# Patient Record
Sex: Male | Born: 1965 | Race: White | Hispanic: No | Smoking: Current every day smoker
Health system: Southern US, Community
[De-identification: ages and names within clinical notes are randomized; demographics above are authoritative.]

## PROBLEM LIST (undated history)

## (undated) DIAGNOSIS — I10 Essential (primary) hypertension: Secondary | ICD-10-CM

---

## 2004-06-30 ENCOUNTER — Emergency Department (HOSPITAL_COMMUNITY): Admission: EM | Admit: 2004-06-30 | Discharge: 2004-06-30 | Payer: Self-pay | Admitting: Emergency Medicine

## 2018-12-22 ENCOUNTER — Inpatient Hospital Stay (HOSPITAL_BASED_OUTPATIENT_CLINIC_OR_DEPARTMENT_OTHER)
Admission: EM | Admit: 2018-12-22 | Discharge: 2019-01-03 | DRG: 193 | Disposition: A | Discharge status: Home / Self Care | Payer: Medicare Other | Attending: Internal Medicine | Admitting: Internal Medicine

## 2018-12-22 ENCOUNTER — Encounter (HOSPITAL_BASED_OUTPATIENT_CLINIC_OR_DEPARTMENT_OTHER): Payer: Self-pay | Admitting: Emergency Medicine

## 2018-12-22 ENCOUNTER — Other Ambulatory Visit: Payer: Self-pay

## 2018-12-22 ENCOUNTER — Emergency Department (HOSPITAL_BASED_OUTPATIENT_CLINIC_OR_DEPARTMENT_OTHER): Payer: Medicare Other

## 2018-12-22 DIAGNOSIS — R740 Nonspecific elevation of levels of transaminase and lactic acid dehydrogenase [LDH]: Secondary | ICD-10-CM | POA: Diagnosis present

## 2018-12-22 DIAGNOSIS — S2242XD Multiple fractures of ribs, left side, subsequent encounter for fracture with routine healing: Secondary | ICD-10-CM

## 2018-12-22 DIAGNOSIS — J189 Pneumonia, unspecified organism: Secondary | ICD-10-CM

## 2018-12-22 DIAGNOSIS — E871 Hypo-osmolality and hyponatremia: Secondary | ICD-10-CM | POA: Diagnosis not present

## 2018-12-22 DIAGNOSIS — R079 Chest pain, unspecified: Secondary | ICD-10-CM | POA: Diagnosis not present

## 2018-12-22 DIAGNOSIS — J80 Acute respiratory distress syndrome: Secondary | ICD-10-CM | POA: Diagnosis present

## 2018-12-22 DIAGNOSIS — R7982 Elevated C-reactive protein (CRP): Secondary | ICD-10-CM | POA: Diagnosis present

## 2018-12-22 DIAGNOSIS — R918 Other nonspecific abnormal finding of lung field: Secondary | ICD-10-CM

## 2018-12-22 DIAGNOSIS — Z791 Long term (current) use of non-steroidal anti-inflammatories (NSAID): Secondary | ICD-10-CM | POA: Diagnosis not present

## 2018-12-22 DIAGNOSIS — R0989 Other specified symptoms and signs involving the circulatory and respiratory systems: Secondary | ICD-10-CM | POA: Diagnosis not present

## 2018-12-22 DIAGNOSIS — J449 Chronic obstructive pulmonary disease, unspecified: Secondary | ICD-10-CM | POA: Diagnosis not present

## 2018-12-22 DIAGNOSIS — W1843XA Slipping, tripping and stumbling without falling due to stepping from one level to another, initial encounter: Secondary | ICD-10-CM | POA: Diagnosis present

## 2018-12-22 DIAGNOSIS — Z79899 Other long term (current) drug therapy: Secondary | ICD-10-CM | POA: Diagnosis not present

## 2018-12-22 DIAGNOSIS — N400 Enlarged prostate without lower urinary tract symptoms: Secondary | ICD-10-CM | POA: Diagnosis present

## 2018-12-22 DIAGNOSIS — F329 Major depressive disorder, single episode, unspecified: Secondary | ICD-10-CM | POA: Diagnosis present

## 2018-12-22 DIAGNOSIS — W109XXA Fall (on) (from) unspecified stairs and steps, initial encounter: Secondary | ICD-10-CM | POA: Diagnosis present

## 2018-12-22 DIAGNOSIS — W19XXXA Unspecified fall, initial encounter: Secondary | ICD-10-CM | POA: Diagnosis not present

## 2018-12-22 DIAGNOSIS — F17200 Nicotine dependence, unspecified, uncomplicated: Secondary | ICD-10-CM | POA: Diagnosis not present

## 2018-12-22 DIAGNOSIS — I10 Essential (primary) hypertension: Secondary | ICD-10-CM | POA: Diagnosis present

## 2018-12-22 DIAGNOSIS — J440 Chronic obstructive pulmonary disease with acute lower respiratory infection: Secondary | ICD-10-CM | POA: Diagnosis present

## 2018-12-22 DIAGNOSIS — S2241XA Multiple fractures of ribs, right side, initial encounter for closed fracture: Secondary | ICD-10-CM | POA: Diagnosis present

## 2018-12-22 DIAGNOSIS — F17210 Nicotine dependence, cigarettes, uncomplicated: Secondary | ICD-10-CM | POA: Diagnosis present

## 2018-12-22 DIAGNOSIS — R0902 Hypoxemia: Secondary | ICD-10-CM | POA: Diagnosis not present

## 2018-12-22 DIAGNOSIS — J181 Lobar pneumonia, unspecified organism: Secondary | ICD-10-CM | POA: Diagnosis present

## 2018-12-22 DIAGNOSIS — J9601 Acute respiratory failure with hypoxia: Secondary | ICD-10-CM | POA: Diagnosis present

## 2018-12-22 DIAGNOSIS — S2249XA Multiple fractures of ribs, unspecified side, initial encounter for closed fracture: Secondary | ICD-10-CM | POA: Diagnosis present

## 2018-12-22 DIAGNOSIS — R069 Unspecified abnormalities of breathing: Secondary | ICD-10-CM

## 2018-12-22 DIAGNOSIS — Z87311 Personal history of (healed) other pathological fracture: Secondary | ICD-10-CM | POA: Diagnosis not present

## 2018-12-22 DIAGNOSIS — I499 Cardiac arrhythmia, unspecified: Secondary | ICD-10-CM | POA: Diagnosis not present

## 2018-12-22 DIAGNOSIS — R0602 Shortness of breath: Secondary | ICD-10-CM | POA: Diagnosis not present

## 2018-12-22 DIAGNOSIS — S2239XA Fracture of one rib, unspecified side, initial encounter for closed fracture: Secondary | ICD-10-CM | POA: Diagnosis not present

## 2018-12-22 DIAGNOSIS — R000 Tachycardia, unspecified: Secondary | ICD-10-CM | POA: Diagnosis not present

## 2018-12-22 DIAGNOSIS — R509 Fever, unspecified: Secondary | ICD-10-CM | POA: Diagnosis not present

## 2018-12-22 DIAGNOSIS — R05 Cough: Secondary | ICD-10-CM | POA: Diagnosis not present

## 2018-12-22 DIAGNOSIS — K219 Gastro-esophageal reflux disease without esophagitis: Secondary | ICD-10-CM | POA: Diagnosis present

## 2018-12-22 DIAGNOSIS — J441 Chronic obstructive pulmonary disease with (acute) exacerbation: Secondary | ICD-10-CM | POA: Diagnosis present

## 2018-12-22 DIAGNOSIS — R0689 Other abnormalities of breathing: Secondary | ICD-10-CM | POA: Diagnosis not present

## 2018-12-22 HISTORY — DX: Essential (primary) hypertension: I10

## 2018-12-22 LAB — COMPREHENSIVE METABOLIC PANEL
ALBUMIN: 4.7 g/dL (ref 3.5–5.0)
ALT: 14 U/L (ref 0–44)
ANION GAP: 17 — AB (ref 5–15)
AST: 32 U/L (ref 15–41)
Alkaline Phosphatase: 65 U/L (ref 38–126)
BUN: 14 mg/dL (ref 6–20)
CO2: 20 mmol/L — AB (ref 22–32)
Calcium: 9 mg/dL (ref 8.9–10.3)
Chloride: 99 mmol/L (ref 98–111)
Creatinine, Ser: 0.75 mg/dL (ref 0.61–1.24)
GFR calc Af Amer: >60 mL/min (ref >60)
GFR calc non Af Amer: >60 mL/min (ref >60)
GLUCOSE: 131 mg/dL — AB (ref 70–99)
POTASSIUM: 3.5 mmol/L (ref 3.5–5.1)
SODIUM: 136 mmol/L (ref 135–145)
Total Bilirubin: 1.7 mg/dL — ABNORMAL HIGH (ref 0.3–1.2)
Total Protein: 8.7 g/dL — ABNORMAL HIGH (ref 6.5–8.1)

## 2018-12-22 LAB — D-DIMER, QUANTITATIVE: D-Dimer, Quant: 2.41 ug/mL-FEU — ABNORMAL HIGH (ref 0.00–0.50)

## 2018-12-22 LAB — RESPIRATORY PANEL BY PCR

## 2018-12-22 LAB — INFLUENZA PANEL BY PCR (TYPE A & B)
INFLAPCR: NEGATIVE
Influenza B By PCR: NEGATIVE

## 2018-12-22 LAB — BRAIN NATRIURETIC PEPTIDE: B NATRIURETIC PEPTIDE 5: 135.2 pg/mL — AB (ref 0.0–100.0)

## 2018-12-22 LAB — TROPONIN I: Troponin I: <0.03 ng/mL (ref <0.03)

## 2018-12-22 MED ORDER — AZITHROMYCIN 500 MG IV SOLR
INTRAVENOUS | Status: AC
  Filled 2018-12-22: qty 500

## 2018-12-22 MED ORDER — LORAZEPAM 2 MG/ML IJ SOLN: 1.0000 mg | Freq: Four times a day (QID) | INTRAMUSCULAR | Status: AC | PRN

## 2018-12-22 MED ORDER — SODIUM CHLORIDE 0.9 % IV SOLN: 1.0000 g | INTRAVENOUS | Status: DC

## 2018-12-22 MED ORDER — UMECLIDINIUM-VILANTEROL 62.5-25 MCG/INH IN AEPB
1.0000 | INHALATION_SPRAY | Freq: Every day | RESPIRATORY_TRACT | Status: DC
  Administered 2018-12-24 – 2018-12-27 (×4): 1 via RESPIRATORY_TRACT
  Filled 2018-12-22 (×2): qty 14

## 2018-12-22 MED ORDER — ADULT MULTIVITAMIN W/MINERALS CH
1.0000 | ORAL_TABLET | Freq: Every day | ORAL | Status: DC
  Administered 2018-12-22 – 2019-01-03 (×13): 1 via ORAL
  Filled 2018-12-22 (×13): qty 1

## 2018-12-22 MED ORDER — IPRATROPIUM-ALBUTEROL 0.5-2.5 (3) MG/3ML IN SOLN
3.0000 mL | Freq: Four times a day (QID) | RESPIRATORY_TRACT | Status: DC
  Administered 2018-12-22: 3 mL via RESPIRATORY_TRACT
  Filled 2018-12-22 (×2): qty 3

## 2018-12-22 MED ORDER — SODIUM CHLORIDE 0.9 % IV SOLN
500.0000 mg | INTRAVENOUS | Status: DC
  Filled 2018-12-22: qty 500

## 2018-12-22 MED ORDER — THIAMINE HCL 100 MG/ML IJ SOLN
100.0000 mg | Freq: Every day | INTRAMUSCULAR | Status: DC
  Filled 2018-12-22 (×5): qty 2

## 2018-12-22 MED ORDER — FOLIC ACID 1 MG PO TABS
1.0000 mg | ORAL_TABLET | Freq: Every day | ORAL | Status: DC
  Administered 2018-12-22 – 2019-01-03 (×13): 1 mg via ORAL
  Filled 2018-12-22 (×14): qty 1

## 2018-12-22 MED ORDER — OXYCODONE HCL 5 MG PO TABS
5.0000 mg | ORAL_TABLET | ORAL | Status: DC | PRN
  Administered 2018-12-22 – 2019-01-03 (×29): 10 mg via ORAL
  Filled 2018-12-22 (×29): qty 2

## 2018-12-22 MED ORDER — SODIUM CHLORIDE 0.9 % IV SOLN
500.0000 mg | INTRAVENOUS | Status: DC
  Administered 2018-12-22 – 2018-12-29 (×8): 500 mg via INTRAVENOUS
  Filled 2018-12-22 (×9): qty 500

## 2018-12-22 MED ORDER — METHYLPREDNISOLONE SODIUM SUCC 125 MG IJ SOLR
60.0000 mg | INTRAMUSCULAR | Status: AC
  Administered 2018-12-22 – 2018-12-23 (×6): 60 mg via INTRAVENOUS
  Filled 2018-12-22 (×6): qty 2

## 2018-12-22 MED ORDER — IPRATROPIUM-ALBUTEROL 0.5-2.5 (3) MG/3ML IN SOLN
3.0000 mL | Freq: Four times a day (QID) | RESPIRATORY_TRACT | Status: DC
  Administered 2018-12-22 (×3): 3 mL via RESPIRATORY_TRACT
  Filled 2018-12-22 (×2): qty 3

## 2018-12-22 MED ORDER — MORPHINE SULFATE (PF) 4 MG/ML IV SOLN
4.0000 mg | Freq: Once | INTRAVENOUS | Status: AC
  Administered 2018-12-22: 4 mg via INTRAVENOUS
  Filled 2018-12-22: qty 1

## 2018-12-22 MED ORDER — ALBUTEROL SULFATE (2.5 MG/3ML) 0.083% IN NEBU
2.5000 mg | INHALATION_SOLUTION | Freq: Three times a day (TID) | RESPIRATORY_TRACT | Status: DC
  Administered 2018-12-23 – 2018-12-24 (×5): 2.5 mg via RESPIRATORY_TRACT
  Filled 2018-12-22 (×6): qty 3

## 2018-12-22 MED ORDER — SODIUM CHLORIDE 0.9 % IV SOLN
2.0000 g | INTRAVENOUS | Status: DC
  Administered 2018-12-22 – 2018-12-31 (×10): 2 g via INTRAVENOUS
  Filled 2018-12-22 (×2): qty 2
  Filled 2018-12-22: qty 20
  Filled 2018-12-22 (×2): qty 2
  Filled 2018-12-22: qty 20
  Filled 2018-12-22 (×4): qty 2

## 2018-12-22 MED ORDER — LORAZEPAM 1 MG PO TABS
1.0000 mg | ORAL_TABLET | Freq: Four times a day (QID) | ORAL | Status: AC | PRN
  Administered 2018-12-23: 1 mg via ORAL
  Filled 2018-12-22: qty 1

## 2018-12-22 MED ORDER — SODIUM CHLORIDE 0.9 % IV BOLUS
1000.0000 mL | Freq: Once | INTRAVENOUS | Status: AC
  Administered 2018-12-22: 1000 mL via INTRAVENOUS

## 2018-12-22 MED ORDER — ENOXAPARIN SODIUM 40 MG/0.4ML ~~LOC~~ SOLN
40.0000 mg | SUBCUTANEOUS | Status: DC
  Administered 2018-12-23 – 2018-12-24 (×2): 40 mg via SUBCUTANEOUS
  Filled 2018-12-22 (×2): qty 0.4

## 2018-12-22 MED ORDER — PREDNISONE 20 MG PO TABS: 40.0000 mg | ORAL_TABLET | Freq: Every day | ORAL | Status: DC

## 2018-12-22 MED ORDER — ALBUTEROL SULFATE (2.5 MG/3ML) 0.083% IN NEBU
2.5000 mg | INHALATION_SOLUTION | RESPIRATORY_TRACT | Status: DC | PRN
  Administered 2018-12-22 – 2018-12-27 (×2): 2.5 mg via RESPIRATORY_TRACT
  Filled 2018-12-22 (×2): qty 3

## 2018-12-22 MED ORDER — IOPAMIDOL (ISOVUE-370) INJECTION 76%
100.0000 mL | Freq: Once | INTRAVENOUS | Status: AC | PRN
  Administered 2018-12-22: 100 mL via INTRAVENOUS

## 2018-12-22 MED ORDER — VITAMIN B-1 100 MG PO TABS
100.0000 mg | ORAL_TABLET | Freq: Every day | ORAL | Status: DC
  Administered 2018-12-22 – 2019-01-03 (×13): 100 mg via ORAL
  Filled 2018-12-22 (×13): qty 1

## 2018-12-22 NOTE — ED Notes (Signed)
Report called to 1515  rn

## 2018-12-22 NOTE — ED Triage Notes (Signed)
Pt reports fall 2  Days ago, ribs pain. Shortness of breath pt in obvious resp distress, alert and oriented x 4.

## 2018-12-22 NOTE — ED Provider Notes (Signed)
Emergency Department Provider Note   I have reviewed the triage vital signs and the nursing notes.   HISTORY  Chief Complaint Shortness of Breath   HPI Eugene Christensen is a 53 y.o. male with PMH of tobacco use and HTN resents to the emergency department for evaluation of difficulty breathing and left chest pain.  Patient states that he had a mechanical fall down several stairs last week and has had rib pain since the fall.  He has developed worsening shortness of breath in the interim associated pain.  He denies productive cough.  No fevers or chills.  Patient does smoke cigarettes but is trying to cut back.  He has no known history of COPD or asthma.  No history of PE or DVT.  Denies pain in his abdomen, back, arms/legs.    Past Medical History:  Diagnosis Date  . Hypertension     Patient Active Problem List   Diagnosis Date Noted  . Pneumonia 12/22/2018  . Multiple rib fractures 12/22/2018  . COPD with acute exacerbation (HCC) 12/22/2018  . Essential hypertension 12/22/2018    History reviewed. No pertinent surgical history.  Allergies Patient has no known allergies.  History reviewed. No pertinent family history.  Social History Social History   Tobacco Use  . Smoking status: Current Every Day Smoker  . Smokeless tobacco: Never Used  Substance Use Topics  . Alcohol use: Yes  . Drug use: Never    Review of Systems  Constitutional: No fever/chills Eyes: No visual changes. ENT: No sore throat. Cardiovascular: Positive chest pain. Respiratory: Positive shortness of breath. Gastrointestinal: No abdominal pain.  No nausea, no vomiting.  No diarrhea.  No constipation. Genitourinary: Negative for dysuria. Musculoskeletal: Negative for back pain. Skin: Negative for rash. Neurological: Negative for headaches, focal weakness or numbness.  10-point ROS otherwise negative.  ____________________________________________   PHYSICAL EXAM:  VITAL SIGNS: ED Triage  Vitals  Enc Vitals Group     BP 12/22/18 1035 (!) 166/101     Pulse Rate 12/22/18 1035 (!) 120     Resp 12/22/18 1035 (!) 26     Temp 12/22/18 1035 98 F (36.7 C)     Temp Source 12/22/18 1035 Oral     SpO2 12/22/18 1035 (!) 70 %     Pain Score 12/22/18 1047 10   Constitutional: Alert and oriented. Patient appears acutely ill and with increased shortness of breath.  Eyes: Conjunctivae are normal.  Head: Atraumatic. Nose: No congestion/rhinnorhea. Mouth/Throat: Mucous membranes are moist. Neck: No stridor.  Cardiovascular: Tachycardia. Good peripheral circulation. Grossly normal heart sounds.   Respiratory: Increased respiratory effort.  No retractions. Lungs with poor air entry and end-expiratory wheezing on exam.  Gastrointestinal: Soft and nontender. No distention.  Musculoskeletal: No lower extremity tenderness nor edema. No gross deformities of extremities. Neurologic:  Normal speech and language. No gross focal neurologic deficits are appreciated.  Skin:  Skin is warm, dry and intact. No rash noted.  ____________________________________________   LABS (all labs ordered are listed, but only abnormal results are displayed)  Labs Reviewed  COMPREHENSIVE METABOLIC PANEL - Abnormal; Notable for the following components:      Result Value   CO2 20 (*)    Glucose, Bld 131 (*)    Total Protein 8.7 (*)    Total Bilirubin 1.7 (*)    Anion gap 17 (*)    All other components within normal limits  BRAIN NATRIURETIC PEPTIDE - Abnormal; Notable for the following components:  B Natriuretic Peptide 135.2 (*)    All other components within normal limits  D-DIMER, QUANTITATIVE (NOT AT [facility]) - Abnormal; Notable for the following components:   D-Dimer, Quant 2.41 (*)    All other components within normal limits  CULTURE, BLOOD (ROUTINE X 2)  CULTURE, BLOOD (ROUTINE X 2)  CULTURE, BLOOD (ROUTINE X 2)  CULTURE, BLOOD (ROUTINE X 2)  EXPECTORATED SPUTUM ASSESSMENT W REFEX TO RESP CULTURE   GRAM STAIN  RESPIRATORY PANEL BY PCR  TROPONIN I  INFLUENZA PANEL BY PCR (TYPE A & B)  RAPID URINE DRUG SCREEN, HOSP PERFORMED  HIV ANTIBODY (ROUTINE TESTING W REFLEX)  STREP PNEUMONIAE URINARY ANTIGEN  CBC WITH DIFFERENTIAL/PLATELET  CBC  CREATININE, SERUM  ADENOVIRUS ANTIBODIES   ____________________________________________  EKG   EKG Interpretation  Date/Time:  Monday December 22 2018 10:41:02 EDT Ventricular Rate:  125 PR Interval:  150 QRS Duration: 84 QT Interval:  316 QTC Calculation: 456 R Axis:   86 Text Interpretation:  Sinus tachycardia Biatrial enlargement Junctional ST depression, probably normal Abnormal ECG No STEMI.  Confirmed by Alona Bene [phone]) on 12/22/2018 11:23:39 AM       ____________________________________________  RADIOLOGY  Ct Angio Chest Pe W And/or Wo Contrast  Result Date: 12/22/2018 CLINICAL DATA:  Shortness of breath PE suspected, high pretest prob EXAM: CT ANGIOGRAPHY CHEST WITH CONTRAST TECHNIQUE: Multidetector CT imaging of the chest was performed using the standard protocol during bolus administration of intravenous contrast. Multiplanar CT image reconstructions and MIPs were obtained to evaluate the vascular anatomy. CONTRAST:  ISOVUE-370 IOPAMIDOL (ISOVUE-370) INJECTION 76% COMPARISON:  None. FINDINGS: Cardiovascular: Heart size normal. No pericardial effusion. Satisfactory opacification of pulmonary arteries noted, and there is no evidence of pulmonary emboli. Moderate coronary calcifications. Adequate contrast opacification of the thoracic aorta with no evidence of dissection, aneurysm, or stenosis. There is classic 3-vessel brachiocephalic arch anatomy without proximal stenosis. Mild calcified plaque in the arch and descending thoracic aorta. Visualized proximal abdominal aorta mildly atheromatous, otherwise unremarkable. Mediastinum/Nodes: No hilar or mediastinal adenopathy. Lungs/Pleura: No pleural effusion. No pneumothorax.  Pulmonary emphysema. Patchy peribronchial airspace opacities medially in the left lower lobe. Upper Abdomen: No acute findings. Musculoskeletal: L1 compression fracture deformity, probably chronic. No acute fracture or worrisome bone lesion. Ununited fracture of the posterolateral aspect right sixth rib. Old healed right eleventh rib fracture. Review of the MIP images confirms the above findings. IMPRESSION: 1. Negative for acute PE or thoracic aortic dissection. 2. Coronary and Aortic Atherosclerosis (ICD10-170.0). 3. Patchy peribronchial airspace opacities medially in the left lower lobe suggesting pneumonia. Electronically Signed   By: Corlis Leak M.D.   On: 12/22/2018 12:20   Dg Chest Portable 1 View  Result Date: 12/22/2018 CLINICAL DATA:  Shortness of breath.  Recent fall EXAM: PORTABLE CHEST 1 VIEW COMPARISON:  June 30, 2004 FINDINGS: There is no evident edema or consolidation. Heart size and pulmonary vascularity are normal. No adenopathy. There is aortic atherosclerosis. No pneumothorax. There is an apparent recent fracture of the posterior right sixth rib. There is an older fracture of the posterior right ninth rib. IMPRESSION: Apparent acute/recent fracture of the posterior right sixth rib. No pneumothorax. No edema or consolidation. Heart size normal. Aortic Atherosclerosis (ICD10-I70.0). Electronically Signed   By: Bretta Bang III M.D.   On: 12/22/2018 11:19    ____________________________________________   PROCEDURES  Procedure(s) performed:   Procedures  CRITICAL CARE Performed by: Maia Plan Total critical care time: 35 minutes Critical care time was exclusive  of separately billable procedures and treating other patients. Critical care was necessary to treat or prevent imminent or life-threatening deterioration. Critical care was time spent personally by me on the following activities: development of treatment plan with patient and/or surrogate as well as nursing,  discussions with consultants, evaluation of patient's response to treatment, examination of patient, obtaining history from patient or surrogate, ordering and performing treatments and interventions, ordering and review of laboratory studies, ordering and review of radiographic studies, pulse oximetry and re-evaluation of patient's condition.  Alona Bene, MD Emergency Medicine  ____________________________________________   INITIAL IMPRESSION / ASSESSMENT AND PLAN / ED COURSE  Pertinent labs & imaging results that were available during my care of the patient were reviewed by me and considered in my medical decision making (see chart for details).  Patient presents to the emergency department with shortness of breath, wheezing in the setting of mechanical fall and likely rib fracture on the left.  On my initial exam the patient has increased work of breathing and wheezing.  He was given back-to-back nebs with somewhat improved vital signs but is requiring supplemental oxygen.  He has bilateral breath sounds with suspicion for pneumothorax being low.  Portable x-ray shows no obvious pneumothorax or infiltrate.  He is not experiencing significant infection symptoms.  No international travel history.  Plan for oxygen supplementation and will follow labs.  Do plan for CTA of the chest to rule out PE, occult pneumonia, or small pneumothorax.   CXR without PNX or obvious infiltrate.   CTA without PE. Does show developing PNA near the fractured ribs. Added abx. On re-evaluation the patient is improving but requiring 4L [place] O2. Plan for abx and admit for further monitoring.  ____________________________________________  FINAL CLINICAL IMPRESSION(S) / ED DIAGNOSES  Final diagnoses:  Hypoxia  SOB (shortness of breath)  Community acquired pneumonia of left lower lobe of lung (HCC)     MEDICATIONS GIVEN DURING THIS VISIT:  Medications  ipratropium-albuterol (DUONEB) 0.5-2.5 (3) MG/3ML nebulizer  solution 3 mL (3 mLs Nebulization Given 12/22/18 1952)  cefTRIAXone (ROCEPHIN) 2 g in sodium chloride 0.9 % 100 mL IVPB (2 g Intravenous Transfusing/Transfer 12/22/18 1430)  azithromycin (ZITHROMAX) 500 mg in sodium chloride 0.9 % 250 mL IVPB ( Intravenous Stopped 12/22/18 1420)  azithromycin (ZITHROMAX) 500 MG injection (has no administration in time range)  enoxaparin (LOVENOX) injection 40 mg (40 mg Subcutaneous Not Given 12/22/18 1520)  umeclidinium-vilanterol (ANORO ELLIPTA) 62.5-25 MCG/INH 1 puff (has no administration in time range)  methylPREDNISolone sodium succinate (SOLU-MEDROL) 125 mg/2 mL injection 60 mg (60 mg Intravenous Given 12/22/18 1858)    Followed by  predniSONE (DELTASONE) tablet 40 mg (has no administration in time range)  albuterol (PROVENTIL) (2.5 MG/3ML) 0.083% nebulizer solution 2.5 mg (2.5 mg Nebulization Given 12/22/18 1418)  oxyCODONE (Oxy IR/ROXICODONE) immediate release tablet 5-10 mg (10 mg Oral Given 12/22/18 1650)  LORazepam (ATIVAN) tablet 1 mg (has no administration in time range)    Or  LORazepam (ATIVAN) injection 1 mg (has no administration in time range)  thiamine (VITAMIN B-1) tablet 100 mg (has no administration in time range)    Or  thiamine (B-1) injection 100 mg (has no administration in time range)  folic acid (FOLVITE) tablet 1 mg (has no administration in time range)  multivitamin with minerals tablet 1 tablet (has no administration in time range)  sodium chloride 0.9 % bolus 1,000 mL ( Intravenous Stopped 12/22/18 1214)  iopamidol (ISOVUE-370) 76 % injection 100 mL (100 mLs Intravenous  Contrast Given 12/22/18 1157)  morphine 4 MG/ML injection 4 mg (4 mg Intravenous Given 12/22/18 1301)    Note:  This document was prepared using Dragon voice recognition software and may include unintentional dictation errors.  Alona Bene, MD Emergency Medicine    Eliazer Hemphill, Arlyss Repress, MD 12/22/18 2035

## 2018-12-22 NOTE — H&P (Signed)
Eugene Christensen is an 53 y.o. male.   Chief Complaint: Fever, cough, shortness of breath. Chest pain. HPI: The patient is a 53 yr old man who fell while going up his stairs at home last Friday. He states that when he fell he immediately had pain on the left side of his chest. He has broken ribs before, and recognizd it. He states that he started feeling sick Saturday morning. He has had cough, shortness of breath and fevers.   The patient has a past medical history significant for hypertension and COPD.   In the ED the patient had a temperature of 98 degrees. He had a respiratory rate of 25. Heart rate was 95. Blood pressure was 181/99. He was saturating at 93% on 6L O2. This has been weaned down to 2L.  Sodium was 136. Potassium was 3.5. CO2 is 20. Creatinine is 0.75. LFT's are in normal range. BNP is 135.2. Troponin ! Was 0.03. D Dimer was 2.41. He is negative for influenza A and B. Respiratory panel is pending.  CXR demonstrated hyperexpansion of the lungs, fracture of the posterior right sixth rib. No pneumothorax or consolidation is seen.  CTA Chest demonstrated no acute PE or thoracic aortic dissection. There was a patchy peribronchial airspace opacities medially in the left lower lobe suggesting pneumonia.   Past Medical History:  Diagnosis Date  . Hypertension     History reviewed. No pertinent surgical history.  History reviewed. No pertinent family history. Social History:  reports that he has been smoking. He has never used smokeless tobacco. He reports current alcohol use. He reports that he does not use drugs. Medications Prior to Admission  Medication Sig Dispense Refill  . acetaminophen (TYLENOL) 500 MG tablet Take 500 mg by mouth daily as needed for moderate pain.    . Albuterol Sulfate (PROAIR HFA IN) Inhale 2 puffs into the lungs every 6 (six) hours as needed (SOB/wheezing).    Marland Kitchen alfuzosin (UROXATRAL) 10 MG 24 hr tablet Take 10 mg by mouth daily.    Marland Kitchen CALCIUM PO Take 1  tablet by mouth daily.    . finasteride (PROSCAR) 5 MG tablet Take 5 mg by mouth daily.    . meloxicam (MOBIC) 15 MG tablet Take 15 mg by mouth daily as needed for pain.    Marland Kitchen omeprazole (PRILOSEC) 20 MG capsule Take 20 mg by mouth daily.    . sertraline (ZOLOFT) 50 MG tablet Take 50 mg by mouth daily.    Marland Kitchen thiamine 100 MG tablet Take 100 mg by mouth daily.    . Tiotropium Bromide-Olodaterol (STIOLTO RESPIMAT) 2.5-2.5 MCG/ACT AERS Inhale 2 puffs into the lungs daily.    . traZODone (DESYREL) 50 MG tablet Take 50 mg by mouth at bedtime.      Allergies: No Known Allergies  Pertinent items are noted in HPI.  General appearance: alert, cooperative and no distress Head: Normocephalic, without obvious abnormality, atraumatic Eyes: conjunctivae/corneas clear. PERRL, EOM's intact. Fundi benign. Throat: lips, mucosa, and tongue normal; teeth and gums normal Neck: no adenopathy, no carotid bruit, no JVD, supple, symmetrical, trachea midline and thyroid not enlarged, symmetric, no tenderness/mass/nodules Resp: No increased work of breathing. Positive for wheezes throughout. Positive for rales on the left. No rhonchi. No tactile fremitujs Chest wall: no tenderness Cardio: regular rate and rhythm, S1, S2 normal, no murmur, click, rub or gallop GI: soft, non-tender; bowel sounds normal; no masses,  no organomegaly Extremities: extremities normal, atraumatic, no cyanosis or edema Pulses: 2+ and  symmetric Skin: Skin color, texture, turgor normal. No rashes or lesions Lymph nodes: Cervical, supraclavicular, and axillary nodes normal. Neurologic: Grossly normal  Results for orders placed or performed during the hospital encounter of 12/22/18 (from the past 48 hour(s))  Comprehensive metabolic panel     Status: Abnormal   Collection Time: 12/22/18 10:52 AM  Result Value Ref Range   Sodium 136 135 - 145 mmol/L   Potassium 3.5 3.5 - 5.1 mmol/L   Chloride 99 98 - 111 mmol/L   CO2 20 (L) 22 - 32 mmol/L    Glucose, Bld 131 (H) 70 - 99 mg/dL   BUN 14 6 - 20 mg/dL   Creatinine, Ser 2.58 0.61 - 1.24 mg/dL   Calcium 9.0 8.9 - 52.7 mg/dL   Total Protein 8.7 (H) 6.5 - 8.1 g/dL   Albumin 4.7 3.5 - 5.0 g/dL   AST 32 15 - 41 U/L   ALT 14 0 - 44 U/L   Alkaline Phosphatase 65 38 - 126 U/L   Total Bilirubin 1.7 (H) 0.3 - 1.2 mg/dL   GFR calc non Af Amer >60 >60 mL/min   GFR calc Af Amer >60 >60 mL/min   Anion gap 17 (H) 5 - 15    Comment: Performed at [facility], [address] [place], [place] [address]  Brain natriuretic peptide     Status: Abnormal   Collection Time: 12/22/18 10:52 AM  Result Value Ref Range   B Natriuretic Peptide 135.2 (H) 0.0 - 100.0 pg/mL    Comment: Performed at [facility], [address] [place], [place] [address]  Troponin I - Once     Status: None   Collection Time: 12/22/18 10:53 AM  Result Value Ref Range   Troponin I <0.03 <0.03 ng/mL    Comment: Performed at [facility], [address] [place], [place] [address]  D-dimer, quantitative (not at [facility])     Status: Abnormal   Collection Time: 12/22/18 11:58 AM  Result Value Ref Range   D-Dimer, Quant 2.41 (H) 0.00 - 0.50 ug/mL-FEU    Comment: (NOTE) At the manufacturer cut-off of 0.50 ug/mL FEU, this assay has been documented to exclude PE with a sensitivity and negative predictive value of 97 to 99%.  At this time, this assay has not been approved by the FDA to exclude DVT/VTE. Results should be correlated with clinical presentation. Performed at [facility], [address] [place], [place] [address]   Blood Culture (routine x 2)     Status: None (Preliminary result)   Collection Time: 12/22/18  1:10 PM  Result Value Ref Range   Specimen Description      BLOOD LEFT FOREARM Performed at [facility], [address], [place], [place] [address]    Special Requests      BOTTLES DRAWN AEROBIC AND ANAEROBIC Blood Culture adequate volume Performed at [facility], [address] [place], [place] [address]    Culture PENDING    Report Status PENDING   Influenza panel by PCR (type A & B)     Status: None   Collection Time: 12/22/18  1:18 PM  Result Value Ref Range   Influenza A By PCR NEGATIVE NEGATIVE   Influenza B By PCR NEGATIVE NEGATIVE    Comment: (NOTE) The Xpert Xpress Flu assay is intended as an aid in the diagnosis of  influenza and should not be  used as a sole basis for treatment.  This  assay is FDA approved for nasopharyngeal swab specimens only. Nasal  washings and aspirates are unacceptable for Xpert Xpress Flu testing. Performed at [facility], [address], [place], [place] [address]    @[email or web address] @  Blood pressure 139/86, pulse (!) 125, temperature 98.2 F (36.8 C), temperature source Oral, resp. rate 20, height 6\' 1"  (1.854 m), weight 68 kg, SpO2 94 %.    Assessment/Plan Problem  Pneumonia  Multiple Rib Fractures  Copd With Acute Exacerbation (Hcc)  Essential Hypertension   The patient will be admitted to a medical bed. He will receive IV rocephin and azithromycin. He will receive pain medication and incentive spirometry. He will be placed on a CIWA protocol as he abuses alcohol.   I have seen and examined this patient myself. I have spent 72 minutes in his evaluation and care.  Julie-Ann Vanmaanen 12/22/2018, 6:35 PM

## 2018-12-22 NOTE — Progress Notes (Signed)
Patient was transferred from [facility] center to 5E at 1530. Alert and oriented x4. No skin issue. Room is set up. Admission nurse was called. Call light is within patient's reach. Vital signs was taken.

## 2018-12-23 LAB — RAPID URINE DRUG SCREEN, HOSP PERFORMED
AMPHETAMINES: NOT DETECTED
BARBITURATES: NOT DETECTED
Benzodiazepines: NOT DETECTED
Cocaine: NOT DETECTED
Opiates: POSITIVE — AB
TETRAHYDROCANNABINOL: NOT DETECTED

## 2018-12-23 LAB — STREP PNEUMONIAE URINARY ANTIGEN: Strep Pneumo Urinary Antigen: NEGATIVE

## 2018-12-23 MED ORDER — SERTRALINE HCL 50 MG PO TABS
50.0000 mg | ORAL_TABLET | Freq: Every day | ORAL | Status: DC
  Administered 2018-12-23 – 2019-01-03 (×12): 50 mg via ORAL
  Filled 2018-12-23 (×12): qty 1

## 2018-12-23 MED ORDER — PANTOPRAZOLE SODIUM 40 MG PO TBEC
40.0000 mg | DELAYED_RELEASE_TABLET | Freq: Every day | ORAL | Status: DC
  Administered 2018-12-23 – 2019-01-03 (×12): 40 mg via ORAL
  Filled 2018-12-23 (×12): qty 1

## 2018-12-23 MED ORDER — NICOTINE 21 MG/24HR TD PT24
21.0000 mg | MEDICATED_PATCH | Freq: Every day | TRANSDERMAL | Status: DC
  Administered 2018-12-23 – 2019-01-03 (×12): 21 mg via TRANSDERMAL
  Filled 2018-12-23 (×13): qty 1

## 2018-12-23 MED ORDER — TRAZODONE HCL 50 MG PO TABS
50.0000 mg | ORAL_TABLET | Freq: Every day | ORAL | Status: DC
  Administered 2018-12-23 – 2019-01-02 (×11): 50 mg via ORAL
  Filled 2018-12-23 (×11): qty 1

## 2018-12-23 MED ORDER — THIAMINE HCL 100 MG PO TABS: 100.0000 mg | ORAL_TABLET | Freq: Every day | ORAL | Status: DC

## 2018-12-23 MED ORDER — FINASTERIDE 5 MG PO TABS
5.0000 mg | ORAL_TABLET | Freq: Every day | ORAL | Status: DC
  Administered 2018-12-24 – 2019-01-03 (×11): 5 mg via ORAL
  Filled 2018-12-23 (×12): qty 1

## 2018-12-23 MED ORDER — CALCIUM CARBONATE 1250 (500 CA) MG PO TABS
1.0000 | ORAL_TABLET | Freq: Every day | ORAL | Status: DC
  Administered 2018-12-23 – 2019-01-03 (×12): 500 mg via ORAL
  Filled 2018-12-23 (×12): qty 1

## 2018-12-23 MED ORDER — ALFUZOSIN HCL ER 10 MG PO TB24
10.0000 mg | ORAL_TABLET | Freq: Every day | ORAL | Status: DC
  Administered 2018-12-24 – 2019-01-03 (×11): 10 mg via ORAL
  Filled 2018-12-23 (×14): qty 1

## 2018-12-23 MED ORDER — METHYLPREDNISOLONE SODIUM SUCC 40 MG IJ SOLR
40.0000 mg | Freq: Two times a day (BID) | INTRAMUSCULAR | Status: DC
  Administered 2018-12-23 – 2018-12-27 (×8): 40 mg via INTRAVENOUS
  Filled 2018-12-23 (×8): qty 1

## 2018-12-23 MED ORDER — CALCIUM 500 MG PO TABS: ORAL_TABLET | Freq: Every day | ORAL | Status: DC

## 2018-12-23 NOTE — Evaluation (Signed)
Occupational Therapy Evaluation Patient Details Name: Eugene Christensen MRN: [id number] DOB: 1966-02-05 Today's Date: 12/23/2018    History of Present Illness Eugene Christensen is a 53 y.o. male with PMH of tobacco use and HTN admitted for evaluation of difficulty breathing and left chest pain.  He had fall on stairs recently and x-rays show rib fx.   Clinical Impression   Pt was admitted for the above.  At baseline, he is independent with adls. He is at a supervision level at this time.  Educated on energy conservation. No further OT is needed at this time    Follow Up Recommendations  No OT follow up    Equipment Recommendations  None recommended by OT    Recommendations for Other Services       Precautions / Restrictions Precautions Precaution Comments: monitor o2 Restrictions Weight Bearing Restrictions: No      Mobility Bed Mobility               General bed mobility comments: OOB. Reviewed rolling to get up from flat bed. Pt has been lifting HOB up  Transfers       Sit to Stand: Modified independent (Device/Increase time)              Balance                                           ADL either performed or assessed with clinical judgement   ADL Overall ADL's : Needs assistance/impaired                                       General ADL Comments: Supervision level. Pt was up in hallway when I arrived; no LOB, wide BOS.  Pt had removed 02:  sats 76-80%  RA then 2 liters 02 applied.  Slow recovery. Educated on energy conservation.  Pt tends to use feet to pick up shoes; he didn't feel he would use a reacher     Vision         Perception     Praxis      Pertinent Vitals/Pain Pain Score: 6  Pain Location: L chest Pain Descriptors / Indicators: Sharp Pain Intervention(s): Limited activity within patient's tolerance;Monitored during session;Premedicated before session;Repositioned     Hand Dominance      Extremity/Trunk Assessment Upper Extremity Assessment Upper Extremity Assessment: Generalized weakness(rib fxs, can lift to 90 bil)           Communication Communication Communication: No difficulties   Cognition Arousal/Alertness: Awake/alert Behavior During Therapy: WFL for tasks assessed/performed Overall Cognitive Status: Within Functional Limits for tasks assessed                                     General Comments       Exercises     Shoulder Instructions      Home Living Family/patient expects to be discharged to:: Private residence Living Arrangements: Parent Available Help at Discharge: Family               Bathroom Shower/Tub: Chief Strategy Officer: Standard     Home Equipment: Shower seat;Grab bars - tub/shower   Additional Comments: has a sink next to  commode      Prior Functioning/Environment Level of Independence: Independent with assistive device(s)        Comments: Amb with cane a few days a week for back pain.         OT Problem List:        OT Treatment/Interventions:      OT Goals(Current goals can be found in the care plan section) Acute Rehab OT Goals Patient Stated Goal: home OT Goal Formulation: All assessment and education complete, DC therapy  OT Frequency:     Barriers to D/C:            Co-evaluation              AM-PAC OT "6 Clicks" Daily Activity     Outcome Measure Help from another person eating meals?: None Help from another person taking care of personal grooming?: A Little Help from another person toileting, which includes using toliet, bedpan, or urinal?: A Little Help from another person bathing (including washing, rinsing, drying)?: A Little Help from another person to put on and taking off regular upper body clothing?: A Little Help from another person to put on and taking off regular lower body clothing?: A Little 6 Click Score: 19   End of Session    Activity  Tolerance: Patient tolerated treatment well Patient left: in bed(EOB)  OT Visit Diagnosis: Muscle weakness (generalized) (M62.81)                Time: 3570-1779 OT Time Calculation (min): 17 min Charges:  OT General Charges $OT Visit: 1 Visit OT Evaluation $OT Eval Low Complexity: 1 Low  Marica Otter, OTR/L Acute Rehabilitation Services [phone] [facility] pager [phone] office 12/23/2018  Eugene Christensen 12/23/2018, 2:08 PM

## 2018-12-23 NOTE — Progress Notes (Signed)
SATURATION QUALIFICATIONS: (This note is used to comply with regulatory documentation for home oxygen)  Patient Saturations on Room Air at Rest = 79%  Patient Saturations on Room Air while Ambulating = NT  Patient Saturations on 4 Liters of oxygen while Ambulating = 89%  Please briefly explain why patient needs home oxygen: Pt de-sats on room air.

## 2018-12-23 NOTE — Evaluation (Signed)
Physical Therapy Evaluation Patient Details Name: Eugene Christensen MRN: [id number] DOB: July 22, 1966 Today's Date: 12/23/2018   History of Present Illness  Eugene Christensen is a 53 y.o. male with PMH of tobacco use and HTN admitted for evaluation of difficulty breathing and left chest pain.  He had fall on stairs recently and x-rays show rib fx.  Clinical Impression  Pt admitted with above diagnosis. Pt currently with functional limitations due to the deficits listed below (see PT Problem List).  Pt will benefit from skilled PT to increase their independence and safety with mobility to allow discharge to the venue listed below.  Pt required 4 L/min to ambulate with o2 at 89% and HR up to 130.  Will follow acutely, but do not feel he will need any follow up PT at time of d/c.     Follow Up Recommendations No PT follow up    Equipment Recommendations  None recommended by PT    Recommendations for Other Services       Precautions / Restrictions Precautions Precautions: Other (comment) Precaution Comments: monitor o2 Restrictions Weight Bearing Restrictions: No      Mobility  Bed Mobility               General bed mobility comments: Pt sitting EOB. He declined bed mobility with bed flat, but PT reviewed safe technique and use of pillow for bracing to help with pain management.  Transfers Overall transfer level: Needs assistance   Transfers: Sit to/from Stand Sit to Stand: Supervision         General transfer comment: S to steady  Ambulation/Gait Ambulation/Gait assistance: Min guard Gait Distance (Feet): 130 Feet Assistive device: (pushed o2 tank for half of gait) Gait Pattern/deviations: Wide base of support;Step-through pattern;Decreased step length - right;Decreased step length - left     General Gait Details: A little unsteady initially with wide BOS, but steadiness improved as gait progressed. o2 at 4 L/min and was 89% with HR 130  Stairs            Wheelchair  Mobility    Modified Rankin (Stroke Patients Only)       Balance Overall balance assessment: Mild deficits observed, not formally tested                                           Pertinent Vitals/Pain Pain Assessment: 0-10 Pain Score: 6  Pain Location: L chest Pain Descriptors / Indicators: Sharp Pain Intervention(s): Limited activity within patient's tolerance;Monitored during session    Home Living Family/patient expects to be discharged to:: Private residence Living Arrangements: Parent Available Help at Discharge: Family   Home Access: Stairs to enter Entrance Stairs-Rails: Right Entrance Stairs-Number of Steps: 3 Home Layout: Two level Home Equipment: Cane - single point;Grab bars - tub/shower Additional Comments: moving at the end of the month to a 1 level home with 2 steps to enter    Prior Function Level of Independence: Independent with assistive device(s)         Comments: Amb with cane a few days a week for back pain.      Hand Dominance        Extremity/Trunk Assessment   Upper Extremity Assessment Upper Extremity Assessment: Defer to OT evaluation    Lower Extremity Assessment Lower Extremity Assessment: Overall WFL for tasks assessed       Communication   Communication:  No difficulties  Cognition Arousal/Alertness: Awake/alert Behavior During Therapy: WFL for tasks assessed/performed Overall Cognitive Status: Within Functional Limits for tasks assessed                                        General Comments General comments (skin integrity, edema, etc.): PT left to get o2 monitor and o2 tank and when returned pt had already taken o2 off.  On RA it was 79%.  o2 re-applied at 2 L/min and was 85% with pursed lip breathing. Increased to 3 then 4 L/min with o2 up to 88%.. Ambulated on 4 L/min and o2 increased to 89% and HR 130. Upon return to room o2 >90%.  Returned to 3 and then 2 L/min.  O2 87% and HR 87bpm  with student nurse present for whole treatment session.    Exercises     Assessment/Plan    PT Assessment Patient needs continued PT services  PT Problem List Decreased activity tolerance;Decreased balance;Cardiopulmonary status limiting activity;Decreased mobility       PT Treatment Interventions Gait training;Stair training;Therapeutic exercise;Therapeutic activities;Functional mobility training;Balance training;Patient/family education    PT Goals (Current goals can be found in the Care Plan section)  Acute Rehab PT Goals Patient Stated Goal: home PT Goal Formulation: With patient Time For Goal Achievement: 12/30/18 Potential to Achieve Goals: Good    Frequency Min 3X/week   Barriers to discharge Inaccessible home environment      Co-evaluation               AM-PAC PT "6 Clicks" Mobility  Outcome Measure Help needed turning from your back to your side while in a flat bed without using bedrails?: A Little Help needed moving from lying on your back to sitting on the side of a flat bed without using bedrails?: A Little Help needed moving to and from a bed to a chair (including a wheelchair)?: None Help needed standing up from a chair using your arms (e.g., wheelchair or bedside chair)?: None Help needed to walk in hospital room?: A Little Help needed climbing 3-5 steps with a railing? : A Little 6 Click Score: 20    End of Session Equipment Utilized During Treatment: Oxygen Activity Tolerance: Treatment limited secondary to medical complications (Comment) Patient left: in bed(sitting EOB) Nurse Communication: Mobility status;Other (comment)(o2, Student nurse present for entire session) PT Visit Diagnosis: Difficulty in walking, not elsewhere classified (R26.2)    Time: 1287-8676 PT Time Calculation (min) (ACUTE ONLY): 28 min   Charges:   PT Evaluation $PT Eval Low Complexity: 1 Low PT Treatments $Gait Training: 8-22 mins        Mindy Gali L. Katrinka Blazing,  [place] Pager [phone] 12/23/2018   Eugene Christensen 12/23/2018, 9:48 AM

## 2018-12-23 NOTE — Progress Notes (Signed)
PROGRESS NOTE  Eugene Christensen  ZOX:[id number] DOB: 1966/04/03 DOA: 12/22/2018 [facility]   Brief Narrative: Eugene Christensen is a 53 y.o. male with a history of tobacco use, alcohol use, and HTN who presented to the ED with increasing dyspnea that started a few days prior, shortly after he tripped going up stairs at his parents' house, falling backwards with resultant significant left-sided chest pain with breathing. He was found to have increased work of breathing, wheezing, and continued to have hypoxia despite continuous nebs. CXR revealed right 6th rib fracture without significant PTX or infiltrate, subsequent CTA chest was performed showing developing LLL pneumonia. Due to ongoing hypoxia, admission was requested.    Assessment & Plan: Active Problems:   Pneumonia   Multiple rib fractures   COPD with acute exacerbation (HCC)   Essential hypertension  Acute hypoxic respiratory failure: Due to COPD with exacerbation, CAP, and likely some splinting from rib fracture. Flu and RVP negative. No recent exposures suggestive of nCoV. - Continues to be significantly hypoxic, continue supplemental oxygen and repeat ambulatory pulse oximetry after improvement prior to DC - Continue CTX, azithromycin, monitoring blood cultures, sputum culture - Continue IV solumedrol and nebulized therapies. Clinically has COPD exacerbation.  Tobacco use:  - Plans to try not to smoke anymore. Cessation counseling provided.  History of alcohol use:  - No evidence of withdrawal at this time.  BPH:  - Continue alfuzosin, finasteride  GERD:  - Continue PPI  Depression:  - Continue SSRI, trazodone  DVT prophylaxis: Lovenox Code Status: Full Family Communication: None at bedside Disposition Plan: Home once respiratory status improves.  Consultants:   None  Procedures:   None  Antimicrobials:  CTX, azithromycin 3/9 >>    Subjective: Breathing more easily than at admission but still short of  breath even a little at rest which is not his baseline. Does not experience shortness of breath at baseline even with moderate walking. Required 4L O2 to maintain SpO2 89% when ambulating with PT today. No new chest pain, left-sided rib pain is stable.   Objective: Vitals:   12/23/18 0754 12/23/18 0938 12/23/18 0939 12/23/18 1320  BP:    138/83  Pulse: 82   92  Resp: 17     Temp:    98.6 F (37 C)  TempSrc:    Oral  SpO2: (!) 81% (!) 79% (!) 89% (!) 86%  Weight:      Height:        Intake/Output Summary (Last 24 hours) at 12/23/2018 1502 Last data filed at 12/23/2018 0100 Gross per 24 hour  Intake 1262.2 ml  Output 100 ml  Net 1162.2 ml   Filed Weights   12/22/18 1600  Weight: 68 kg    Gen: 53 y.o. male in no distress  Pulm: Nonlabored, diffusely decreased with scant end-expiratory wheezing bilaterally.  CV: Regular rate and rhythm. No murmur, rub, or gallop. No JVD, no pedal edema. GI: Abdomen soft, non-tender, non-distended, with normoactive bowel sounds. No organomegaly or masses felt. Ext: Warm, no deformities Skin: No rashes, lesions or ulcers Neuro: Alert and oriented. No focal neurological deficits. Psych: Judgement and insight appear normal. Mood & affect appropriate.   Data Reviewed: I have personally reviewed following labs and imaging studies  CBC: No results for input(s): WBC, NEUTROABS, HGB, HCT, MCV, PLT in the last 168 hours. Basic Metabolic Panel: Recent Labs  Lab 12/22/18 1052  NA 136  K 3.5  CL 99  CO2 20*  GLUCOSE 131*  BUN 14  CREATININE 0.75  CALCIUM 9.0   GFR: Estimated Creatinine Clearance: 102.7 mL/min (by C-G formula based on SCr of 0.75 mg/dL). Liver Function Tests: Recent Labs  Lab 12/22/18 1052  AST 32  ALT 14  ALKPHOS 65  BILITOT 1.7*  PROT 8.7*  ALBUMIN 4.7   No results for input(s): LIPASE, AMYLASE in the last 168 hours. No results for input(s): AMMONIA in the last 168 hours. Coagulation Profile: No results for  input(s): INR, PROTIME in the last 168 hours. Cardiac Enzymes: Recent Labs  Lab 12/22/18 1053  TROPONINI <0.03   BNP (last 3 results) No results for input(s): PROBNP in the last 8760 hours. HbA1C: No results for input(s): HGBA1C in the last 72 hours. CBG: No results for input(s): GLUCAP in the last 168 hours. Lipid Profile: No results for input(s): CHOL, HDL, LDLCALC, TRIG, CHOLHDL, LDLDIRECT in the last 72 hours. Thyroid Function Tests: No results for input(s): TSH, T4TOTAL, FREET4, T3FREE, THYROIDAB in the last 72 hours. Anemia Panel: No results for input(s): VITAMINB12, FOLATE, FERRITIN, TIBC, IRON, RETICCTPCT in the last 72 hours. Urine analysis: No results found for: COLORURINE, APPEARANCEUR, LABSPEC, PHURINE, GLUCOSEU, HGBUR, BILIRUBINUR, KETONESUR, PROTEINUR, UROBILINOGEN, NITRITE, LEUKOCYTESUR Recent Results (from the past 240 hour(s))  Blood Culture (routine x 2)     Status: None (Preliminary result)   Collection Time: 12/22/18 12:45 PM  Result Value Ref Range Status   Specimen Description   Final    BLOOD RIGHT ANTECUBITAL Performed at [facility], [address] [place], [place] [address]    Special Requests   Final    BOTTLES DRAWN AEROBIC AND ANAEROBIC Blood Culture adequate volume Performed at [facility], [address] [place], [place] [address]    Culture   Final    NO GROWTH < 24 HOURS Performed at [facility], [address] [place], [place] [address]    Report Status PENDING  Incomplete  Blood Culture (routine x 2)     Status: None (Preliminary result)   Collection Time: 12/22/18  1:10 PM  Result Value Ref Range Status   Specimen Description   Final    BLOOD LEFT FOREARM Performed at [facility], [address] [place], [place] [address]    Special Requests   Final    BOTTLES DRAWN AEROBIC AND ANAEROBIC Blood Culture adequate volume Performed at [facility], [address] [place], [place]  [address]    Culture   Final    NO GROWTH < 24 HOURS Performed at [facility], [address], [place], [place] [address]    Report Status PENDING  Incomplete  Respiratory Panel by PCR     Status: None   Collection Time: 12/22/18  3:41 PM  Result Value Ref Range Status   Adenovirus NOT DETECTED NOT DETECTED Final   Coronavirus 229E NOT DETECTED NOT DETECTED Final    Comment: (NOTE) The Coronavirus on the Respiratory Panel, DOES NOT test for the novel  Coronavirus (2019 nCoV)    Coronavirus HKU1 NOT DETECTED NOT DETECTED Final   Coronavirus NL63 NOT DETECTED NOT DETECTED Final   Coronavirus OC43 NOT DETECTED NOT DETECTED Final   Metapneumovirus NOT DETECTED NOT DETECTED Final   Rhinovirus / Enterovirus NOT DETECTED NOT DETECTED Final   Influenza A NOT DETECTED NOT DETECTED Final   Influenza B NOT DETECTED NOT DETECTED Final   Parainfluenza Virus 1 NOT DETECTED NOT DETECTED Final  Parainfluenza Virus 2 NOT DETECTED NOT DETECTED Final   Parainfluenza Virus 3 NOT DETECTED NOT DETECTED Final   Parainfluenza Virus 4 NOT DETECTED NOT DETECTED Final   Respiratory Syncytial Virus NOT DETECTED NOT DETECTED Final   Bordetella pertussis NOT DETECTED NOT DETECTED Final   Chlamydophila pneumoniae NOT DETECTED NOT DETECTED Final   Mycoplasma pneumoniae NOT DETECTED NOT DETECTED Final    Comment: Performed at [facility], [address], [place], [place] [address]      Radiology Studies: Ct Angio Chest Pe W And/or Wo Contrast  Result Date: 12/22/2018 CLINICAL DATA:  Shortness of breath PE suspected, high pretest prob EXAM: CT ANGIOGRAPHY CHEST WITH CONTRAST TECHNIQUE: Multidetector CT imaging of the chest was performed using the standard protocol during bolus administration of intravenous contrast. Multiplanar CT image reconstructions and MIPs were obtained to evaluate the vascular anatomy. CONTRAST:  ISOVUE-370 IOPAMIDOL (ISOVUE-370) INJECTION 76% COMPARISON:  None. FINDINGS:  Cardiovascular: Heart size normal. No pericardial effusion. Satisfactory opacification of pulmonary arteries noted, and there is no evidence of pulmonary emboli. Moderate coronary calcifications. Adequate contrast opacification of the thoracic aorta with no evidence of dissection, aneurysm, or stenosis. There is classic 3-vessel brachiocephalic arch anatomy without proximal stenosis. Mild calcified plaque in the arch and descending thoracic aorta. Visualized proximal abdominal aorta mildly atheromatous, otherwise unremarkable. Mediastinum/Nodes: No hilar or mediastinal adenopathy. Lungs/Pleura: No pleural effusion. No pneumothorax. Pulmonary emphysema. Patchy peribronchial airspace opacities medially in the left lower lobe. Upper Abdomen: No acute findings. Musculoskeletal: L1 compression fracture deformity, probably chronic. No acute fracture or worrisome bone lesion. Ununited fracture of the posterolateral aspect right sixth rib. Old healed right eleventh rib fracture. Review of the MIP images confirms the above findings. IMPRESSION: 1. Negative for acute PE or thoracic aortic dissection. 2. Coronary and Aortic Atherosclerosis (ICD10-170.0). 3. Patchy peribronchial airspace opacities medially in the left lower lobe suggesting pneumonia. Electronically Signed   By: Corlis Leak M.D.   On: 12/22/2018 12:20   Dg Chest Portable 1 View  Result Date: 12/22/2018 CLINICAL DATA:  Shortness of breath.  Recent fall EXAM: PORTABLE CHEST 1 VIEW COMPARISON:  June 30, 2004 FINDINGS: There is no evident edema or consolidation. Heart size and pulmonary vascularity are normal. No adenopathy. There is aortic atherosclerosis. No pneumothorax. There is an apparent recent fracture of the posterior right sixth rib. There is an older fracture of the posterior right ninth rib. IMPRESSION: Apparent acute/recent fracture of the posterior right sixth rib. No pneumothorax. No edema or consolidation. Heart size normal. Aortic  Atherosclerosis (ICD10-I70.0). Electronically Signed   By: Bretta Bang III M.D.   On: 12/22/2018 11:19    Scheduled Meds: . albuterol  2.5 mg Nebulization TID  . alfuzosin  10 mg Oral Daily  . calcium carbonate  1 tablet Oral Q breakfast  . enoxaparin (LOVENOX) injection  40 mg Subcutaneous Q24H  . finasteride  5 mg Oral Daily  . folic acid  1 mg Oral Daily  . multivitamin with minerals  1 tablet Oral Daily  . pantoprazole  40 mg Oral Daily  . [START ON 12/24/2018] predniSONE  40 mg Oral Q breakfast  . sertraline  50 mg Oral Daily  . thiamine  100 mg Oral Daily   Or  . thiamine  100 mg Intravenous Daily  . traZODone  50 mg Oral QHS  . umeclidinium-vilanterol  1 puff Inhalation Daily   Continuous Infusions: . azithromycin 500 mg (12/23/18 1358)  . cefTRIAXone (ROCEPHIN)  IV  2 g (12/22/18 1429)     LOS: 1 day   Time spent: 25 minutes.  Tyrone Nine, MD [facility] [email or web address] Password [email or web address] 12/23/2018, 3:02 PM

## 2018-12-23 NOTE — Progress Notes (Signed)
Nutrition Brief Note  RD consulted via COPD protocol.  No weight changes in records. Patient has weighed ~155 lb since February 2019.  Wt Readings from Last 15 Encounters:  12/22/18 68 kg    Body mass index is 19.79 kg/m. Patient meets criteria for normal based on current BMI.   Current diet order is regular. Labs and medications reviewed.   No nutrition interventions warranted at this time. If nutrition issues arise, please consult RD.   Tilda Franco, MS, RD, LDN [facility] Clinical Dietitian Pager: [phone] After Hours Pager: [phone]

## 2018-12-24 DIAGNOSIS — R0902 Hypoxemia: Secondary | ICD-10-CM

## 2018-12-24 MED ORDER — ALBUTEROL SULFATE (2.5 MG/3ML) 0.083% IN NEBU
2.5000 mg | INHALATION_SOLUTION | RESPIRATORY_TRACT | Status: DC
  Administered 2018-12-24: 2.5 mg via RESPIRATORY_TRACT
  Filled 2018-12-24: qty 3

## 2018-12-24 MED ORDER — ALBUTEROL SULFATE (2.5 MG/3ML) 0.083% IN NEBU
2.5000 mg | INHALATION_SOLUTION | RESPIRATORY_TRACT | Status: DC
  Administered 2018-12-25 (×4): 2.5 mg via RESPIRATORY_TRACT
  Filled 2018-12-24 (×4): qty 3

## 2018-12-24 NOTE — Progress Notes (Signed)
PROGRESS NOTE    Bracyn Amicone  EBR:[id number] DOB: 12/03/1965 DOA: 12/22/2018 [facility]CP: [facility]    Brief Narrative:  53 y.o. male with a history of tobacco use, alcohol use, and HTN who presented to the ED with increasing dyspnea that started a few days prior, shortly after he tripped going up stairs at his parents' house, falling backwards with resultant significant left-sided chest pain with breathing. He was found to have increased work of breathing, wheezing, and continued to have hypoxia despite continuous nebs. CXR revealed right 6th rib fracture without significant PTX or infiltrate, subsequent CTA chest was performed showing developing LLL pneumonia. Due to ongoing hypoxia, admission was requested.   Assessment & Plan:   Active Problems:   Pneumonia   Multiple rib fractures   COPD with acute exacerbation (HCC)   Essential hypertension  Acute hypoxic respiratory failure:  -Likely secondary to COPD with exacerbation, CAP, and splinting from rib fracture.  -Flu and RVP negative.  -No recent exposures suggestive of nCoV. - Remains O2 dependent. Continue to wean O2 as tolerated -Will give trial of flutter valve - Cont on empiric abx -Wean steroids as tolerated  Tobacco use:  - Plans to try not to smoke anymore.  -Cessation counseling provided this admit  History of alcohol use:  - No evidence of withdrawal at this time. -Stable at present  BPH:  - Continue alfuzosin, finasteride -Stable at present  GERD:  - Continue PPI - Stable at this time  Depression:  - Continue SSRI, trazodone -Stable at present  DVT prophylaxis: Lovenox subQ Code Status: Full Family Communication: Pt in room, family not at bedside Disposition Plan: Uncertain at this time  Consultants:     Procedures:     Antimicrobials: Anti-infectives (From admission, onward)   Start     Dose/Rate Route Frequency Ordered Stop   12/22/18 1345  cefTRIAXone (ROCEPHIN) 1 g in sodium  chloride 0.9 % 100 mL IVPB  Status:  Discontinued     1 g 200 mL/hr over 30 Minutes Intravenous Every 24 hours 12/22/18 1331 12/22/18 1332   12/22/18 1345  azithromycin (ZITHROMAX) 500 mg in sodium chloride 0.9 % 250 mL IVPB  Status:  Discontinued     500 mg 250 mL/hr over 60 Minutes Intravenous Every 24 hours 12/22/18 1331 12/22/18 1332   12/22/18 1245  cefTRIAXone (ROCEPHIN) 2 g in sodium chloride 0.9 % 100 mL IVPB     2 g 200 mL/hr over 30 Minutes Intravenous Every 24 hours 12/22/18 1230     12/22/18 1245  azithromycin (ZITHROMAX) 500 mg in sodium chloride 0.9 % 250 mL IVPB     500 mg 250 mL/hr over 60 Minutes Intravenous Every 24 hours 12/22/18 1230     12/22/18 1245  azithromycin (ZITHROMAX) 500 MG injection    Note to Pharmacy:  Gertie Baron   : cabinet override      12/22/18 1245 12/23/18 0059       Subjective: Feeling better  Objective: Vitals:   12/23/18 2121 12/24/18 0800 12/24/18 1312 12/24/18 1359  BP:    (!) 146/77  Pulse:   88 94  Resp:    17  Temp:    98.3 F (36.8 C)  TempSrc:    Oral  SpO2: 93% (!) 84% 92% 90%  Weight:      Height:        Intake/Output Summary (Last 24 hours) at 12/24/2018 1404 Last data filed at 12/24/2018 1032 Gross per 24 hour  Intake  240 ml  Output -  Net 240 ml   Filed Weights   12/22/18 1600  Weight: 68 kg    Examination:  General exam: Appears calm and comfortable  Respiratory system: coarse breath sounds, end-expiratory wheezing. Respiratory effort normal. Cardiovascular system: S1 & S2 heard, RRR.  Gastrointestinal system: Abdomen is nondistended, soft and nontender. No organomegaly or masses felt. Normal bowel sounds heard. Central nervous system: Alert and oriented. No focal neurological deficits. Extremities: Symmetric 5 x 5 power. Skin: No rashes, lesions  Psychiatry: Judgement and insight appear normal. Mood & affect appropriate.   Data Reviewed: I have personally reviewed following labs and imaging studies   CBC: No results for input(s): WBC, NEUTROABS, HGB, HCT, MCV, PLT in the last 168 hours. Basic Metabolic Panel: Recent Labs  Lab 12/22/18 1052  NA 136  K 3.5  CL 99  CO2 20*  GLUCOSE 131*  BUN 14  CREATININE 0.75  CALCIUM 9.0   GFR: Estimated Creatinine Clearance: 102.7 mL/min (by C-G formula based on SCr of 0.75 mg/dL). Liver Function Tests: Recent Labs  Lab 12/22/18 1052  AST 32  ALT 14  ALKPHOS 65  BILITOT 1.7*  PROT 8.7*  ALBUMIN 4.7   No results for input(s): LIPASE, AMYLASE in the last 168 hours. No results for input(s): AMMONIA in the last 168 hours. Coagulation Profile: No results for input(s): INR, PROTIME in the last 168 hours. Cardiac Enzymes: Recent Labs  Lab 12/22/18 1053  TROPONINI <0.03   BNP (last 3 results) No results for input(s): PROBNP in the last 8760 hours. HbA1C: No results for input(s): HGBA1C in the last 72 hours. CBG: No results for input(s): GLUCAP in the last 168 hours. Lipid Profile: No results for input(s): CHOL, HDL, LDLCALC, TRIG, CHOLHDL, LDLDIRECT in the last 72 hours. Thyroid Function Tests: No results for input(s): TSH, T4TOTAL, FREET4, T3FREE, THYROIDAB in the last 72 hours. Anemia Panel: No results for input(s): VITAMINB12, FOLATE, FERRITIN, TIBC, IRON, RETICCTPCT in the last 72 hours. Sepsis Labs: No results for input(s): PROCALCITON, LATICACIDVEN in the last 168 hours.  Recent Results (from the past 240 hour(s))  Blood Culture (routine x 2)     Status: None (Preliminary result)   Collection Time: 12/22/18 12:45 PM  Result Value Ref Range Status   Specimen Description   Final    BLOOD RIGHT ANTECUBITAL Performed at [facility], [address] [place], [place] [address]    Special Requests   Final    BOTTLES DRAWN AEROBIC AND ANAEROBIC Blood Culture adequate volume Performed at [facility], [address] [place], [place] [address]    Culture   Final    NO GROWTH 2 DAYS Performed at  [facility], [address] [place], [place] [address]    Report Status PENDING  Incomplete  Blood Culture (routine x 2)     Status: None (Preliminary result)   Collection Time: 12/22/18  1:10 PM  Result Value Ref Range Status   Specimen Description   Final    BLOOD LEFT FOREARM Performed at [facility], [address], [place], [place] [address]    Special Requests   Final    BOTTLES DRAWN AEROBIC AND ANAEROBIC Blood Culture adequate volume Performed at [facility], [address] [place], [place] [address]    Culture   Final    NO GROWTH 2 DAYS Performed at [facility], [address]  [place], [place] [address]    Report Status PENDING  Incomplete  Respiratory Panel by PCR     Status: None   Collection Time: 12/22/18  3:41 PM  Result Value Ref Range Status   Adenovirus NOT DETECTED NOT DETECTED Final   Coronavirus 229E NOT DETECTED NOT DETECTED Final    Comment: (NOTE) The Coronavirus on the Respiratory Panel, DOES NOT test for the novel  Coronavirus (2019 nCoV)    Coronavirus HKU1 NOT DETECTED NOT DETECTED Final   Coronavirus NL63 NOT DETECTED NOT DETECTED Final   Coronavirus OC43 NOT DETECTED NOT DETECTED Final   Metapneumovirus NOT DETECTED NOT DETECTED Final   Rhinovirus / Enterovirus NOT DETECTED NOT DETECTED Final   Influenza A NOT DETECTED NOT DETECTED Final   Influenza B NOT DETECTED NOT DETECTED Final   Parainfluenza Virus 1 NOT DETECTED NOT DETECTED Final   Parainfluenza Virus 2 NOT DETECTED NOT DETECTED Final   Parainfluenza Virus 3 NOT DETECTED NOT DETECTED Final   Parainfluenza Virus 4 NOT DETECTED NOT DETECTED Final   Respiratory Syncytial Virus NOT DETECTED NOT DETECTED Final   Bordetella pertussis NOT DETECTED NOT DETECTED Final   Chlamydophila pneumoniae NOT DETECTED NOT DETECTED Final   Mycoplasma pneumoniae NOT DETECTED NOT DETECTED Final    Comment: Performed at [facility], [address], [place],  [place] [address]     Radiology Studies: No results found.  Scheduled Meds: . albuterol  2.5 mg Nebulization TID  . alfuzosin  10 mg Oral Daily  . calcium carbonate  1 tablet Oral Q breakfast  . enoxaparin (LOVENOX) injection  40 mg Subcutaneous Q24H  . finasteride  5 mg Oral Daily  . folic acid  1 mg Oral Daily  . methylPREDNISolone (SOLU-MEDROL) injection  40 mg Intravenous Q12H  . multivitamin with minerals  1 tablet Oral Daily  . nicotine  21 mg Transdermal Daily  . pantoprazole  40 mg Oral Daily  . sertraline  50 mg Oral Daily  . thiamine  100 mg Oral Daily   Or  . thiamine  100 mg Intravenous Daily  . traZODone  50 mg Oral QHS  . umeclidinium-vilanterol  1 puff Inhalation Daily   Continuous Infusions: . azithromycin 500 mg (12/24/18 1307)  . cefTRIAXone (ROCEPHIN)  IV 2 g (12/24/18 1303)     LOS: 2 days   Rickey Barbara, MD [facility] Pager On Amion  If 7PM-7AM, please contact night-coverage 12/24/2018, 2:04 PM

## 2018-12-25 LAB — GLUCOSE, CAPILLARY: Glucose-Capillary: 140 mg/dL — ABNORMAL HIGH (ref 70–99)

## 2018-12-25 NOTE — Care Management Important Message (Signed)
Important Message  Patient Details  Name: Cordaryl Mikeal MRN: [id number] Date of Birth: 12-20-1965   Medicare Important Message Given:  Yes    Caren Macadam 12/25/2018, 10:32 AMImportant Message  Patient Details  Name: Kate Stoneburner MRN: [id number] Date of Birth: [birth year]   Medicare Important Message Given:  Yes    Caren Macadam 12/25/2018, 10:32 AM

## 2018-12-25 NOTE — Progress Notes (Signed)
PROGRESS NOTE    Rollins Pusey  OAC:[id number] DOB: September 22, 1966 DOA: 12/22/2018 [facility]CP: [facility]    Brief Narrative:  53 y.o. male with a history of tobacco use, alcohol use, and HTN who presented to the ED with increasing dyspnea that started a few days prior, shortly after he tripped going up stairs at his parents' house, falling backwards with resultant significant left-sided chest pain with breathing. He was found to have increased work of breathing, wheezing, and continued to have hypoxia despite continuous nebs. CXR revealed right 6th rib fracture without significant PTX or infiltrate, subsequent CTA chest was performed showing developing LLL pneumonia. Due to ongoing hypoxia, admission was requested.   Assessment & Plan:   Active Problems:   Pneumonia   Multiple rib fractures   COPD with acute exacerbation (HCC)   Essential hypertension  Acute hypoxic respiratory failure:  -Likely secondary to COPD with exacerbation, CAP, and splinting from rib fracture.  -Flu and RVP negative.  -No recent exposures suggestive of nCoV. - Remains O2 dependent. Continue to wean O2 as tolerated -Increased O2 requirements this AM with coarse breath sounds -continue with flutter valve as tolerated - Cont on empiric abx -Wean steroids as tolerated  Tobacco use:  - Plans to try not to smoke anymore.  -Cessation counseling provided this admit -Stable at present  History of alcohol use:  - No evidence of withdrawal at this time. - currently stable  BPH:  - Continue alfuzosin, finasteride - Presently stable  GERD:  - Continue PPI - remains stable  Depression:  - Continue SSRI, trazodone -Stable at present  DVT prophylaxis: Lovenox subQ Code Status: Full Family Communication: Pt in room, family not at bedside Disposition Plan: Uncertain at this time  Consultants:     Procedures:     Antimicrobials: Anti-infectives (From admission, onward)   Start     Dose/Rate  Route Frequency Ordered Stop   12/22/18 1345  cefTRIAXone (ROCEPHIN) 1 g in sodium chloride 0.9 % 100 mL IVPB  Status:  Discontinued     1 g 200 mL/hr over 30 Minutes Intravenous Every 24 hours 12/22/18 1331 12/22/18 1332   12/22/18 1345  azithromycin (ZITHROMAX) 500 mg in sodium chloride 0.9 % 250 mL IVPB  Status:  Discontinued     500 mg 250 mL/hr over 60 Minutes Intravenous Every 24 hours 12/22/18 1331 12/22/18 1332   12/22/18 1245  cefTRIAXone (ROCEPHIN) 2 g in sodium chloride 0.9 % 100 mL IVPB     2 g 200 mL/hr over 30 Minutes Intravenous Every 24 hours 12/22/18 1230     12/22/18 1245  azithromycin (ZITHROMAX) 500 mg in sodium chloride 0.9 % 250 mL IVPB     500 mg 250 mL/hr over 60 Minutes Intravenous Every 24 hours 12/22/18 1230     12/22/18 1245  azithromycin (ZITHROMAX) 500 MG injection    Note to Pharmacy:  Gertie Baron   : cabinet override      12/22/18 1245 12/23/18 0059      Subjective: Reports worse sob this AM  Objective: Vitals:   12/25/18 0551 12/25/18 0745 12/25/18 1210 12/25/18 1440  BP: (!) 141/89   136/84  Pulse: 83   95  Resp: (!) 23     Temp: 98.3 F (36.8 C)   98.7 F (37.1 C)  TempSrc: Oral   Oral  SpO2: 90% 90% 90% (!) 88%  Weight:      Height:        Intake/Output Summary (Last 24  hours) at 12/25/2018 1539 Last data filed at 12/25/2018 1400 Gross per 24 hour  Intake 1990 ml  Output -  Net 1990 ml   Filed Weights   12/22/18 1600  Weight: 68 kg    Examination: General exam: Awake, laying in bed, in nad Respiratory system: coarse breath sounds, end-expiratory wheezing Cardiovascular system: regular rate, s1, s2 Gastrointestinal system: Soft, nondistended, positive BS Central nervous system: CN2-12 grossly intact, strength intact Extremities: Perfused, no clubbing Skin: Normal skin turgor, no notable skin lesions seen Psychiatry: Mood normal // no visual hallucinations   Data Reviewed: I have personally reviewed following labs and  imaging studies  CBC: No results for input(s): WBC, NEUTROABS, HGB, HCT, MCV, PLT in the last 168 hours. Basic Metabolic Panel: Recent Labs  Lab 12/22/18 1052  NA 136  K 3.5  CL 99  CO2 20*  GLUCOSE 131*  BUN 14  CREATININE 0.75  CALCIUM 9.0   GFR: Estimated Creatinine Clearance: 102.7 mL/min (by C-G formula based on SCr of 0.75 mg/dL). Liver Function Tests: Recent Labs  Lab 12/22/18 1052  AST 32  ALT 14  ALKPHOS 65  BILITOT 1.7*  PROT 8.7*  ALBUMIN 4.7   No results for input(s): LIPASE, AMYLASE in the last 168 hours. No results for input(s): AMMONIA in the last 168 hours. Coagulation Profile: No results for input(s): INR, PROTIME in the last 168 hours. Cardiac Enzymes: Recent Labs  Lab 12/22/18 1053  TROPONINI <0.03   BNP (last 3 results) No results for input(s): PROBNP in the last 8760 hours. HbA1C: No results for input(s): HGBA1C in the last 72 hours. CBG: Recent Labs  Lab 12/25/18 0803  GLUCAP 140*   Lipid Profile: No results for input(s): CHOL, HDL, LDLCALC, TRIG, CHOLHDL, LDLDIRECT in the last 72 hours. Thyroid Function Tests: No results for input(s): TSH, T4TOTAL, FREET4, T3FREE, THYROIDAB in the last 72 hours. Anemia Panel: No results for input(s): VITAMINB12, FOLATE, FERRITIN, TIBC, IRON, RETICCTPCT in the last 72 hours. Sepsis Labs: No results for input(s): PROCALCITON, LATICACIDVEN in the last 168 hours.  Recent Results (from the past 240 hour(s))  Blood Culture (routine x 2)     Status: None (Preliminary result)   Collection Time: 12/22/18 12:45 PM  Result Value Ref Range Status   Specimen Description   Final    BLOOD RIGHT ANTECUBITAL Performed at [facility], [address] [place], [place] [address]    Special Requests   Final    BOTTLES DRAWN AEROBIC AND ANAEROBIC Blood Culture adequate volume Performed at [facility], [address] [place], [place] [address]    Culture   Final    NO GROWTH 3 DAYS  Performed at [facility], [address] [place], [place] [address]    Report Status PENDING  Incomplete  Blood Culture (routine x 2)     Status: None (Preliminary result)   Collection Time: 12/22/18  1:10 PM  Result Value Ref Range Status   Specimen Description   Final    BLOOD LEFT FOREARM Performed at [facility], [address] [place], [place] [address]    Special Requests   Final    BOTTLES DRAWN AEROBIC AND ANAEROBIC Blood Culture adequate volume Performed at [facility], [address] [place], [place] [address]    Culture   Final    NO GROWTH 3 DAYS Performed at [facility], [address], [place], [place]  [address]    Report Status PENDING  Incomplete  Respiratory Panel by PCR     Status: None   Collection Time: 12/22/18  3:41 PM  Result Value Ref Range Status   Adenovirus NOT DETECTED NOT DETECTED Final   Coronavirus 229E NOT DETECTED NOT DETECTED Final    Comment: (NOTE) The Coronavirus on the Respiratory Panel, DOES NOT test for the novel  Coronavirus (2019 nCoV)    Coronavirus HKU1 NOT DETECTED NOT DETECTED Final   Coronavirus NL63 NOT DETECTED NOT DETECTED Final   Coronavirus OC43 NOT DETECTED NOT DETECTED Final   Metapneumovirus NOT DETECTED NOT DETECTED Final   Rhinovirus / Enterovirus NOT DETECTED NOT DETECTED Final   Influenza A NOT DETECTED NOT DETECTED Final   Influenza B NOT DETECTED NOT DETECTED Final   Parainfluenza Virus 1 NOT DETECTED NOT DETECTED Final   Parainfluenza Virus 2 NOT DETECTED NOT DETECTED Final   Parainfluenza Virus 3 NOT DETECTED NOT DETECTED Final   Parainfluenza Virus 4 NOT DETECTED NOT DETECTED Final   Respiratory Syncytial Virus NOT DETECTED NOT DETECTED Final   Bordetella pertussis NOT DETECTED NOT DETECTED Final   Chlamydophila pneumoniae NOT DETECTED NOT DETECTED Final   Mycoplasma pneumoniae NOT DETECTED NOT DETECTED Final    Comment: Performed at [facility], [address]  [place], [place] [address]     Radiology Studies: No results found.  Scheduled Meds: . albuterol  2.5 mg Nebulization Q4H WA  . alfuzosin  10 mg Oral Daily  . calcium carbonate  1 tablet Oral Q breakfast  . enoxaparin (LOVENOX) injection  40 mg Subcutaneous Q24H  . finasteride  5 mg Oral Daily  . folic acid  1 mg Oral Daily  . methylPREDNISolone (SOLU-MEDROL) injection  40 mg Intravenous Q12H  . multivitamin with minerals  1 tablet Oral Daily  . nicotine  21 mg Transdermal Daily  . pantoprazole  40 mg Oral Daily  . sertraline  50 mg Oral Daily  . thiamine  100 mg Oral Daily   Or  . thiamine  100 mg Intravenous Daily  . traZODone  50 mg Oral QHS  . umeclidinium-vilanterol  1 puff Inhalation Daily   Continuous Infusions: . azithromycin 500 mg (12/25/18 1224)  . cefTRIAXone (ROCEPHIN)  IV 2 g (12/25/18 1343)     LOS: 3 days   Rickey Barbara, MD [facility] Pager On Amion  If 7PM-7AM, please contact night-coverage 12/25/2018, 3:39 PM

## 2018-12-26 ENCOUNTER — Inpatient Hospital Stay (HOSPITAL_COMMUNITY): Payer: Medicare Other

## 2018-12-26 LAB — COMPREHENSIVE METABOLIC PANEL
ALT: 12 U/L (ref 0–44)
ANION GAP: 8 (ref 5–15)
AST: 17 U/L (ref 15–41)
Albumin: 3 g/dL — ABNORMAL LOW (ref 3.5–5.0)
Alkaline Phosphatase: 47 U/L (ref 38–126)
BUN: 10 mg/dL (ref 6–20)
CO2: 31 mmol/L (ref 22–32)
Calcium: 8.2 mg/dL — ABNORMAL LOW (ref 8.9–10.3)
Chloride: 95 mmol/L — ABNORMAL LOW (ref 98–111)
Creatinine, Ser: 0.68 mg/dL (ref 0.61–1.24)
GFR calc Af Amer: >60 mL/min (ref >60)
GFR calc non Af Amer: >60 mL/min (ref >60)
GLUCOSE: 129 mg/dL — AB (ref 70–99)
Potassium: 4.3 mmol/L (ref 3.5–5.1)
Sodium: 134 mmol/L — ABNORMAL LOW (ref 135–145)
Total Bilirubin: 0.7 mg/dL (ref 0.3–1.2)
Total Protein: 6.2 g/dL — ABNORMAL LOW (ref 6.5–8.1)

## 2018-12-26 LAB — CBC
HCT: 43 % (ref 39.0–52.0)
Hemoglobin: 13.9 g/dL (ref 13.0–17.0)
MCH: 32.6 pg (ref 26.0–34.0)
MCHC: 32.3 g/dL (ref 30.0–36.0)
MCV: 100.7 fL — ABNORMAL HIGH (ref 80.0–100.0)
Platelets: 229 10*3/uL (ref 150–400)
RBC: 4.27 MIL/uL (ref 4.22–5.81)
RDW: 12.8 % (ref 11.5–15.5)
WBC: 20.9 10*3/uL — ABNORMAL HIGH (ref 4.0–10.5)
nRBC: 0 % (ref 0.0–0.2)

## 2018-12-26 MED ORDER — ALBUTEROL SULFATE (2.5 MG/3ML) 0.083% IN NEBU
2.5000 mg | INHALATION_SOLUTION | Freq: Four times a day (QID) | RESPIRATORY_TRACT | Status: DC
  Administered 2018-12-26 – 2018-12-27 (×4): 2.5 mg via RESPIRATORY_TRACT
  Filled 2018-12-26 (×5): qty 3

## 2018-12-26 MED ORDER — GUAIFENESIN ER 600 MG PO TB12
600.0000 mg | ORAL_TABLET | Freq: Two times a day (BID) | ORAL | Status: DC
  Administered 2018-12-26 – 2019-01-03 (×16): 600 mg via ORAL
  Filled 2018-12-26 (×16): qty 1

## 2018-12-26 NOTE — Progress Notes (Signed)
PROGRESS NOTE    Eugene Christensen  HER:[id number] DOB: October 30, 1965 DOA: 12/22/2018 PCP: [facility]    Brief Narrative:  53 y.o. male with a history of tobacco use, alcohol use, and HTN who presented to the ED with increasing dyspnea that started a few days prior, shortly after he tripped going up stairs at his parents' house, falling backwards with resultant significant left-sided chest pain with breathing. He was found to have increased work of breathing, wheezing, and continued to have hypoxia despite continuous nebs. CXR revealed right 6th rib fracture without significant PTX or infiltrate, subsequent CTA chest was performed showing developing LLL pneumonia. Due to ongoing hypoxia, admission was requested.   Assessment & Plan:   Active Problems:   Pneumonia   Multiple rib fractures   COPD with acute exacerbation (HCC)   Essential hypertension  Acute hypoxic respiratory failure:  -Likely secondary to COPD with exacerbation, CAP, and splinting from rib fracture.  -Flu and RVP negative.  -No recent exposures suggestive of nCoV. - Remains O2 dependent. Continue to wean O2 as tolerated -Increased O2 requirements this AM with coarse breath sounds -continue with flutter valve as tolerated - Cont on empiric abx - Increased O2 requirements noted overnight - end-expiratory wheezing on exam. Given known rib fractures, will check CXR for interval change, ordered  Tobacco use:  - Plans to try not to smoke anymore.  -Cessation counseling provided this admit -Stable at this time  History of alcohol use:  - No evidence of withdrawal at this time. - presently stable  BPH:  - Continue alfuzosin, finasteride - Currently stable  GERD:  - Continue PPI - remains stable at this time  Depression:  - Continue SSRI, trazodone -Stable at present  Acute R 6th rib fracture -Noted on 3/9 imaging -no pneumothorax, edema or consolidation. Reviewed -As pt is requiring more O2, will  repeat CXR per above. Pending  DVT prophylaxis: Lovenox subQ Code Status: Full Family Communication: Pt in room, family not at bedside Disposition Plan: Uncertain at this time  Consultants:     Procedures:     Antimicrobials: Anti-infectives (From admission, onward)   Start     Dose/Rate Route Frequency Ordered Stop   12/22/18 1345  cefTRIAXone (ROCEPHIN) 1 g in sodium chloride 0.9 % 100 mL IVPB  Status:  Discontinued     1 g 200 mL/hr over 30 Minutes Intravenous Every 24 hours 12/22/18 1331 12/22/18 1332   12/22/18 1345  azithromycin (ZITHROMAX) 500 mg in sodium chloride 0.9 % 250 mL IVPB  Status:  Discontinued     500 mg 250 mL/hr over 60 Minutes Intravenous Every 24 hours 12/22/18 1331 12/22/18 1332   12/22/18 1245  cefTRIAXone (ROCEPHIN) 2 g in sodium chloride 0.9 % 100 mL IVPB     2 g 200 mL/hr over 30 Minutes Intravenous Every 24 hours 12/22/18 1230     12/22/18 1245  azithromycin (ZITHROMAX) 500 mg in sodium chloride 0.9 % 250 mL IVPB     500 mg 250 mL/hr over 60 Minutes Intravenous Every 24 hours 12/22/18 1230     12/22/18 1245  azithromycin (ZITHROMAX) 500 MG injection    Note to Pharmacy:  Gertie Baron   : cabinet override      12/22/18 1245 12/23/18 0059      Subjective: Noted to be more sob overnight  Objective: Vitals:   12/25/18 2103 12/26/18 0538 12/26/18 0943 12/26/18 1420  BP: 132/73 (!) 143/87  (!) 147/87  Pulse: 86 78  (!)  102  Resp: 16 16  20   Temp:  98 F (36.7 C)  98.2 F (36.8 C)  TempSrc:  Oral  Oral  SpO2: 94% 93% 91% (!) 86%  Weight:      Height:        Intake/Output Summary (Last 24 hours) at 12/26/2018 1651 Last data filed at 12/26/2018 0900 Gross per 24 hour  Intake 360 ml  Output -  Net 360 ml   Filed Weights   12/22/18 1600  Weight: 68 kg    Examination: General exam: Conversant, in no acute distress Respiratory system: normal chest rise, clear, end-expiratory wheezing Cardiovascular system: regular rhythm, s1-s2  Gastrointestinal system: Nondistended, nontender, pos BS Central nervous system: No seizures, no tremors Extremities: No cyanosis, no joint deformities Skin: No rashes, no pallor Psychiatry: Affect normal // no auditory hallucinations   Data Reviewed: I have personally reviewed following labs and imaging studies  CBC: Recent Labs  Lab 12/26/18 0625  WBC 20.9*  HGB 13.9  HCT 43.0  MCV 100.7*  PLT 229   Basic Metabolic Panel: Recent Labs  Lab 12/22/18 1052 12/26/18 0625  NA 136 134*  K 3.5 4.3  CL 99 95*  CO2 20* 31  GLUCOSE 131* 129*  BUN 14 10  CREATININE 0.75 0.68  CALCIUM 9.0 8.2*   GFR: Estimated Creatinine Clearance: 102.7 mL/min (by C-G formula based on SCr of 0.68 mg/dL). Liver Function Tests: Recent Labs  Lab 12/22/18 1052 12/26/18 0625  AST 32 17  ALT 14 12  ALKPHOS 65 47  BILITOT 1.7* 0.7  PROT 8.7* 6.2*  ALBUMIN 4.7 3.0*   No results for input(s): LIPASE, AMYLASE in the last 168 hours. No results for input(s): AMMONIA in the last 168 hours. Coagulation Profile: No results for input(s): INR, PROTIME in the last 168 hours. Cardiac Enzymes: Recent Labs  Lab 12/22/18 1053  TROPONINI <0.03   BNP (last 3 results) No results for input(s): PROBNP in the last 8760 hours. HbA1C: No results for input(s): HGBA1C in the last 72 hours. CBG: Recent Labs  Lab 12/25/18 0803  GLUCAP 140*   Lipid Profile: No results for input(s): CHOL, HDL, LDLCALC, TRIG, CHOLHDL, LDLDIRECT in the last 72 hours. Thyroid Function Tests: No results for input(s): TSH, T4TOTAL, FREET4, T3FREE, THYROIDAB in the last 72 hours. Anemia Panel: No results for input(s): VITAMINB12, FOLATE, FERRITIN, TIBC, IRON, RETICCTPCT in the last 72 hours. Sepsis Labs: No results for input(s): PROCALCITON, LATICACIDVEN in the last 168 hours.  Recent Results (from the past 240 hour(s))  Blood Culture (routine x 2)     Status: None (Preliminary result)   Collection Time: 12/22/18 12:45 PM   Result Value Ref Range Status   Specimen Description   Final    BLOOD RIGHT ANTECUBITAL Performed at [facility], [address] [place], [place] [address]    Special Requests   Final    BOTTLES DRAWN AEROBIC AND ANAEROBIC Blood Culture adequate volume Performed at [facility], [address] [place], [place] [address]    Culture   Final    NO GROWTH 3 DAYS Performed at [facility], [address] [place], [place] [address]    Report Status PENDING  Incomplete  Blood Culture (routine x 2)     Status: None (Preliminary result)   Collection Time: 12/22/18  1:10 PM  Result Value Ref Range Status   Specimen Description   Final    BLOOD LEFT FOREARM  Performed at [facility], [address], [place], [place] [address]    Special Requests   Final    BOTTLES DRAWN AEROBIC AND ANAEROBIC Blood Culture adequate volume Performed at [facility] [address] [place], [place] [address]    Culture   Final    NO GROWTH 3 DAYS Performed at [facility], [address], [place], [place] [address]    Report Status PENDING  Incomplete  Respiratory Panel by PCR     Status: None   Collection Time: 12/22/18  3:41 PM  Result Value Ref Range Status   Adenovirus NOT DETECTED NOT DETECTED Final   Coronavirus 229E NOT DETECTED NOT DETECTED Final    Comment: (NOTE) The Coronavirus on the Respiratory Panel, DOES NOT test for the novel  Coronavirus (2019 nCoV)    Coronavirus HKU1 NOT DETECTED NOT DETECTED Final   Coronavirus NL63 NOT DETECTED NOT DETECTED Final   Coronavirus OC43 NOT DETECTED NOT DETECTED Final   Metapneumovirus NOT DETECTED NOT DETECTED Final   Rhinovirus / Enterovirus NOT DETECTED NOT DETECTED Final   Influenza A NOT DETECTED NOT DETECTED Final   Influenza B NOT DETECTED NOT DETECTED Final   Parainfluenza Virus 1 NOT DETECTED NOT DETECTED Final   Parainfluenza Virus 2 NOT DETECTED NOT DETECTED Final   Parainfluenza  Virus 3 NOT DETECTED NOT DETECTED Final   Parainfluenza Virus 4 NOT DETECTED NOT DETECTED Final   Respiratory Syncytial Virus NOT DETECTED NOT DETECTED Final   Bordetella pertussis NOT DETECTED NOT DETECTED Final   Chlamydophila pneumoniae NOT DETECTED NOT DETECTED Final   Mycoplasma pneumoniae NOT DETECTED NOT DETECTED Final    Comment: Performed at [facility], [address], [place], [place] [address]     Radiology Studies: No results found.  Scheduled Meds: . albuterol  2.5 mg Nebulization Q6H WA  . alfuzosin  10 mg Oral Daily  . calcium carbonate  1 tablet Oral Q breakfast  . finasteride  5 mg Oral Daily  . folic acid  1 mg Oral Daily  . guaiFENesin  600 mg Oral BID  . methylPREDNISolone (SOLU-MEDROL) injection  40 mg Intravenous Q12H  . multivitamin with minerals  1 tablet Oral Daily  . nicotine  21 mg Transdermal Daily  . pantoprazole  40 mg Oral Daily  . sertraline  50 mg Oral Daily  . thiamine  100 mg Oral Daily   Or  . thiamine  100 mg Intravenous Daily  . traZODone  50 mg Oral QHS  . umeclidinium-vilanterol  1 puff Inhalation Daily   Continuous Infusions: . azithromycin 500 mg (12/26/18 1320)  . cefTRIAXone (ROCEPHIN)  IV 2 g (12/26/18 1526)     LOS: 4 days   Rickey Barbara, MD [facility] Pager On Amion  If 7PM-7AM, please contact night-coverage 12/26/2018, 4:51 PM

## 2018-12-26 NOTE — Progress Notes (Signed)
Physical Therapy Treatment Patient Details Name: Eugene Christensen MRN: [id number] DOB: [birth year] Today's Date: 12/26/2018    History of Present Illness Eugene Christensen is a 53 y.o. male with PMH of tobacco use and HTN admitted for evaluation of difficulty breathing and left chest pain.  He had fall on stairs recently and x-rays show rib fx..  Pt admitted for acute hypoxic respiratory failure secondary to COPD with exacerbation, CAP, and splinting from rib fracture    PT Comments    On arrival, pt had HFNC removed to use tissue and SPO2 75% on room air.  Reapplied 10L HFNC and SPO2 improved to 88%.  Pt ambulated in hallway however SPO2 dropped to 70% on 10L so limited distance and returned to pt's room.  Pt reports he has been ambulating in hallway without any O2 during this admission.  Pt educated not to ambulate without oxygen at this time due to increased flow and demand.  RN notified.   Follow Up Recommendations  No PT follow up     Equipment Recommendations  None recommended by PT    Recommendations for Other Services       Precautions / Restrictions Precautions Precautions: Other (comment) Precaution Comments: monitor o2    Mobility  Bed Mobility Overal bed mobility: Modified Independent                Transfers Overall transfer level: Needs assistance Equipment used: None Transfers: Sit to/from Stand Sit to Stand: Supervision         General transfer comment: supervision for line  Ambulation/Gait Ambulation/Gait assistance: Min guard;Supervision Gait Distance (Feet): 100 Feet Assistive device: None Gait Pattern/deviations: Step-through pattern;Decreased stride length     General Gait Details: pt reports mild dyspnea, requiring 10L O2 HFNC and SPO2 dropped to 70%, standing rest break after 50 feet and then ambulated back to room to return to sitting due to low SPO2   Stairs             Wheelchair Mobility    Modified Rankin (Stroke Patients Only)        Balance                                            Cognition Arousal/Alertness: Awake/alert Behavior During Therapy: WFL for tasks assessed/performed Overall Cognitive Status: Within Functional Limits for tasks assessed                                        Exercises      General Comments        Pertinent Vitals/Pain Pain Assessment: 0-10 Pain Score: 4  Pain Location: L chest Pain Descriptors / Indicators: Sharp Pain Intervention(s): Premedicated before session;Monitored during session    Home Living                      Prior Function            PT Goals (current goals can now be found in the care plan section) Progress towards PT goals: Progressing toward goals    Frequency    Min 3X/week      PT Plan Current plan remains appropriate    Co-evaluation              AM-PAC PT "6  Clicks" Mobility   Outcome Measure  Help needed turning from your back to your side while in a flat bed without using bedrails?: None Help needed moving from lying on your back to sitting on the side of a flat bed without using bedrails?: None Help needed moving to and from a bed to a chair (including a wheelchair)?: None Help needed standing up from a chair using your arms (e.g., wheelchair or bedside chair)?: None Help needed to walk in hospital room?: A Little Help needed climbing 3-5 steps with a railing? : A Little 6 Click Score: 22    End of Session Equipment Utilized During Treatment: Oxygen Activity Tolerance: Patient tolerated treatment well Patient left: in bed;with call bell/phone within reach Nurse Communication: Mobility status(SPO2 with ambulation) PT Visit Diagnosis: Difficulty in walking, not elsewhere classified (R26.2)     Time: 0950-1010 PT Time Calculation (min) (ACUTE ONLY): 20 min  Charges:  $Gait Training: 8-22 mins                     Zenovia Jarred, PT, DPT Acute Rehabilitation  Services Office: [phone] Pager: [phone]   Sarajane Jews 12/26/2018, 12:44 PM

## 2018-12-27 ENCOUNTER — Inpatient Hospital Stay (HOSPITAL_COMMUNITY): Payer: Medicare Other

## 2018-12-27 DIAGNOSIS — J9601 Acute respiratory failure with hypoxia: Secondary | ICD-10-CM

## 2018-12-27 DIAGNOSIS — I10 Essential (primary) hypertension: Secondary | ICD-10-CM

## 2018-12-27 DIAGNOSIS — R0902 Hypoxemia: Secondary | ICD-10-CM

## 2018-12-27 LAB — BASIC METABOLIC PANEL
Anion gap: 13 (ref 5–15)
BUN: 10 mg/dL (ref 6–20)
CO2: 29 mmol/L (ref 22–32)
Calcium: 8.4 mg/dL — ABNORMAL LOW (ref 8.9–10.3)
Chloride: 95 mmol/L — ABNORMAL LOW (ref 98–111)
Creatinine, Ser: 0.57 mg/dL — ABNORMAL LOW (ref 0.61–1.24)
GFR calc Af Amer: >60 mL/min (ref >60)
GFR calc non Af Amer: >60 mL/min (ref >60)
Glucose, Bld: 121 mg/dL — ABNORMAL HIGH (ref 70–99)
Potassium: 3.4 mmol/L — ABNORMAL LOW (ref 3.5–5.1)
Sodium: 137 mmol/L (ref 135–145)

## 2018-12-27 LAB — CBC
HEMATOCRIT: 40.9 % (ref 39.0–52.0)
Hemoglobin: 13.7 g/dL (ref 13.0–17.0)
MCH: 33.1 pg (ref 26.0–34.0)
MCHC: 33.5 g/dL (ref 30.0–36.0)
MCV: 98.8 fL (ref 80.0–100.0)
Platelets: 251 10*3/uL (ref 150–400)
RBC: 4.14 MIL/uL — ABNORMAL LOW (ref 4.22–5.81)
RDW: 12.8 % (ref 11.5–15.5)
WBC: 19 10*3/uL — ABNORMAL HIGH (ref 4.0–10.5)
nRBC: 0 % (ref 0.0–0.2)

## 2018-12-27 LAB — PROCALCITONIN: Procalcitonin: <0.1 ng/mL

## 2018-12-27 LAB — CULTURE, BLOOD (ROUTINE X 2)
Culture: NO GROWTH
Culture: NO GROWTH
SPECIAL REQUESTS: ADEQUATE
Special Requests: ADEQUATE

## 2018-12-27 LAB — LACTATE DEHYDROGENASE: LDH: 233 U/L — AB (ref 98–192)

## 2018-12-27 LAB — C-REACTIVE PROTEIN: CRP: 6.6 mg/dL — ABNORMAL HIGH (ref <1.0)

## 2018-12-27 LAB — MRSA PCR SCREENING: MRSA by PCR: NEGATIVE

## 2018-12-27 LAB — FERRITIN: Ferritin: 349 ng/mL — ABNORMAL HIGH (ref 24–336)

## 2018-12-27 MED ORDER — IPRATROPIUM-ALBUTEROL 0.5-2.5 (3) MG/3ML IN SOLN: 3.0000 mL | RESPIRATORY_TRACT | Status: DC | PRN

## 2018-12-27 MED ORDER — FUROSEMIDE 10 MG/ML IJ SOLN
40.0000 mg | Freq: Two times a day (BID) | INTRAMUSCULAR | Status: DC
  Administered 2018-12-27 – 2019-01-02 (×12): 40 mg via INTRAVENOUS
  Filled 2018-12-27 (×12): qty 4

## 2018-12-27 MED ORDER — TIOTROPIUM BROMIDE MONOHYDRATE 18 MCG IN CAPS: 18.0000 ug | ORAL_CAPSULE | Freq: Every day | RESPIRATORY_TRACT | Status: DC

## 2018-12-27 MED ORDER — UMECLIDINIUM BROMIDE 62.5 MCG/INH IN AEPB
1.0000 | INHALATION_SPRAY | Freq: Every day | RESPIRATORY_TRACT | Status: DC
  Administered 2018-12-28 – 2018-12-29 (×2): 1 via RESPIRATORY_TRACT
  Filled 2018-12-27: qty 7

## 2018-12-27 MED ORDER — IPRATROPIUM-ALBUTEROL 0.5-2.5 (3) MG/3ML IN SOLN
3.0000 mL | Freq: Four times a day (QID) | RESPIRATORY_TRACT | Status: DC
  Administered 2018-12-27: 3 mL via RESPIRATORY_TRACT
  Filled 2018-12-27: qty 3

## 2018-12-27 NOTE — Consult Note (Signed)
NAME:  Eugene Christensen, MRN:  [id number], DOB:  12-02-1965, LOS: 5 ADMISSION DATE:  12/22/2018, CONSULTATION DATE:  12/22/2018 REFERRING MD:  Dr Rhona Leavens, CHIEF COMPLAINT:  Worsening resp failure iwht infiltrates since admission 5d earlier   Brief History   See below  History of present illness   52 year from [address] [facility] [place] [address] -  with copd, bp but indepdent ADL. On 12/19/2018 Friday tripped and  fell down going up stairs and felt pain on left side of chest. Says prior to that was feeling baseline. After that only intense left sided pain that presented through ER visit 12/22/2018. durring this he had no myalgia, fatigue or flu like symptoms. But due to persistent pain and related dyspnea he decided to go to ER. Just prior to ER arrival he felt brief chills.  In ER3/06/2019 (no CBC data at admit avialable)  was afebrile at 28F. D-dimer was high 2.41 but PE reuled out. Ruled out standard RVP and flu panel and urine streo. CTA shows patch LLL infiltrates (also showed -> Ununited fracture of the posterolateral aspect right sixth rib. Old healed right eleventh rib fracture - CONTRALATERAL TO PAIN from Fall.) . He was hypoxemic needing 6LNC at arrival but by time of admit weaned down to 2L [place] and was wheesy.  Course 3/10 - Hospitalist noted: Breathing more easily than at admission but still short of breath even a little at rest which is not his baseline. Does not experience shortness of breath at baseline even with moderate walking. Required 4L O2 to maintain SpO2 89% when ambulating with PT today. No new chest pain, left-sided rib pain is stable.    3/14 - RN noted: Pt sitting up in bed with non-rebreather on and O2 saturation 88%. Alert and oriented x4. No accessory muscle use and able to speak in full sentences without having to catch breath. O2 saturations up to 96%. Non-rebreather removed and HFNC reapplied at 15L. O2 sats remained 94%. Saturations did drop with exertion to 88% but able to recover  back up to 94%.   Paient transfer to SDU and ccm consul]ed. CXR with worsening bilateral infiltrates  COVIDROME profile  - history:  initial symptoms are not c.w COVID - travel: none - sick cluster exposure: none (works at [employer], last worked 2d prior to fall and does not remember travelers from beyond [place]). No sick family cluster - low WBC at admit - data not available, currently high on steroids  -PCT:  < 0.1 and can be c/w Covid - Alternative diagnosis: None available and so raises question of covid - Course: Worsening infiltrates and can be c/w Covid  Past Medical History    has a past medical history of Hypertension.   reports that he has been smoking. He has never used smokeless tobacco.  History reviewed. No pertinent surgical history.  No Known Allergies   There is no immunization history on file for this patient.  History reviewed. No pertinent family history.   Current Facility-Administered Medications:  .  alfuzosin (UROXATRAL) 24 hr tablet 10 mg, 10 mg, Oral, Daily, Jerald Kief, MD, 10 mg at 12/27/18 1001 .  azithromycin (ZITHROMAX) 500 mg in sodium chloride 0.9 % 250 mL IVPB, 500 mg, Intravenous, Q24H, Jerald Kief, MD, Last Rate: 250 mL/hr at 12/27/18 1219, 500 mg at 12/27/18 1219 .  calcium carbonate (OS-CAL - dosed in mg of elemental calcium) tablet 500 mg of elemental calcium, 1 tablet, Oral, Q breakfast,  Jerald Kief, MD, 500 mg of elemental calcium at 12/27/18 1001 .  cefTRIAXone (ROCEPHIN) 2 g in sodium chloride 0.9 % 100 mL IVPB, 2 g, Intravenous, Q24H, Jerald Kief, MD, Stopped at 12/26/18 1556 .  finasteride (PROSCAR) tablet 5 mg, 5 mg, Oral, Daily, Jerald Kief, MD, 5 mg at 12/27/18 1002 .  folic acid (FOLVITE) tablet 1 mg, 1 mg, Oral, Daily, Jerald Kief, MD, 1 mg at 12/27/18 1001 .  guaiFENesin (MUCINEX) 12 hr tablet 600 mg, 600 mg, Oral, BID, Jerald Kief, MD, 600 mg at 12/27/18 1001 .  ipratropium-albuterol (DUONEB)  0.5-2.5 (3) MG/3ML nebulizer solution 3 mL, 3 mL, Nebulization, Q2H PRN, Jerald Kief, MD .  ipratropium-albuterol (DUONEB) 0.5-2.5 (3) MG/3ML nebulizer solution 3 mL, 3 mL, Nebulization, Q6H, Jerald Kief, MD .  methylPREDNISolone sodium succinate (SOLU-MEDROL) 40 mg/mL injection 40 mg, 40 mg, Intravenous, Q12H, Jerald Kief, MD, 40 mg at 12/27/18 0357 .  multivitamin with minerals tablet 1 tablet, 1 tablet, Oral, Daily, Jerald Kief, MD, 1 tablet at 12/27/18 1001 .  nicotine (NICODERM CQ - dosed in mg/24 hours) patch 21 mg, 21 mg, Transdermal, Daily, Jerald Kief, MD, 21 mg at 12/27/18 1000 .  oxyCODONE (Oxy IR/ROXICODONE) immediate release tablet 5-10 mg, 5-10 mg, Oral, Q4H PRN, Jerald Kief, MD, 10 mg at 12/27/18 1209 .  pantoprazole (PROTONIX) EC tablet 40 mg, 40 mg, Oral, Daily, Jerald Kief, MD, 40 mg at 12/27/18 1001 .  sertraline (ZOLOFT) tablet 50 mg, 50 mg, Oral, Daily, Jerald Kief, MD, 50 mg at 12/27/18 1001 .  thiamine (VITAMIN B-1) tablet 100 mg, 100 mg, Oral, Daily, 100 mg at 12/27/18 1001 **OR** thiamine (B-1) injection 100 mg, 100 mg, Intravenous, Daily, Jerald Kief, MD .  traZODone (DESYREL) tablet 50 mg, 50 mg, Oral, QHS, Jerald Kief, MD, 50 mg at 12/26/18 2138    Significant Hospital Events   12/22/2018 - admoit 3/14 - SDU transfer for worsening hypoxemia   Consults:  3/14 - CCM  Procedures:  x  Significant Diagnostic Tests:    Micro Data:  3/9 - flu pcr neg 3/9 - rvp - neg 3/9 urine strep -neg 3/9 - urine tox -opioids osive  Antimicrobials:   Anti-infectives (From admission, onward)   Start     Dose/Rate Route Frequency Ordered Stop   12/22/18 1345  cefTRIAXone (ROCEPHIN) 1 g in sodium chloride 0.9 % 100 mL IVPB  Status:  Discontinued     1 g 200 mL/hr over 30 Minutes Intravenous Every 24 hours 12/22/18 1331 12/22/18 1332   12/22/18 1345  azithromycin (ZITHROMAX) 500 mg in sodium chloride 0.9 % 250 mL IVPB  Status:   Discontinued     500 mg 250 mL/hr over 60 Minutes Intravenous Every 24 hours 12/22/18 1331 12/22/18 1332   12/22/18 1245  cefTRIAXone (ROCEPHIN) 2 g in sodium chloride 0.9 % 100 mL IVPB     2 g 200 mL/hr over 30 Minutes Intravenous Every 24 hours 12/22/18 1230     12/22/18 1245  azithromycin (ZITHROMAX) 500 mg in sodium chloride 0.9 % 250 mL IVPB     500 mg 250 mL/hr over 60 Minutes Intravenous Every 24 hours 12/22/18 1230     12/22/18 1245  azithromycin (ZITHROMAX) 500 MG injection    Note to Pharmacy:  Gertie Baron   : cabinet override      12/22/18 1245 12/23/18 0059       Interim  history/subjective:  3/15 - 15L [place], talking onphone, sitting in bed  Objective   Blood pressure (!) 152/72, pulse 95, temperature 97.6 F (36.4 C), temperature source Oral, resp. rate 20, height  (1.854 m), weight 68 kg, SpO2 92 %.        Intake/Output Summary (Last 24 hours) at 12/27/2018 1256 Last data filed at 12/27/2018 1100 Gross per 24 hour  Intake -  Output 1564 ml  Net -1564 ml   Filed Weights   12/22/18 1600  Weight: 68 kg    Examination: General: lean male, sitting in bed, talking on phone HENT: no neck nodes, On 15L [place] Lungs: Barrell chest,  Scatteered crackles Cardiovascular: mild tachy. Normal heart sound Abdomen: soft, non tender Extremities: intact, no cyanosis, no clubbing, no edema Neuro: AxOx3. Speech normal GU: not examined   LABS    PULMONARY No results for input(s): PHART, PCO2ART, PO2ART, HCO3, TCO2, O2SAT in the last 168 hours.  Invalid input(s): PCO2, PO2  CBC Recent Labs  Lab 12/26/18 0625 12/27/18 0553  HGB 13.9 13.7  HCT 43.0 40.9  WBC 20.9* 19.0*  PLT 229 251    COAGULATION No results for input(s): INR in the last 168 hours.  CARDIAC   Recent Labs  Lab 12/22/18 1053  TROPONINI <0.03   No results for input(s): PROBNP in the last 168 hours.   CHEMISTRY Recent Labs  Lab 12/22/18 1052 12/26/18 0625 12/27/18 0553  NA 136 134*  137  K 3.5 4.3 3.4*  CL 99 95* 95*  CO2 20* 31 29  GLUCOSE 131* 129* 121*  BUN CREATININE 0.75 0.68 0.57*  CALCIUM 9.0 8.2* 8.4*   Estimated Creatinine Clearance: 94.3 mL/min (A) (by C-G formula based on SCr of 0.57 mg/dL (L)).   LIVER Recent Labs  Lab 12/22/18 1052 12/26/18 0625  AST 32 17  ALT 14 12  ALKPHOS 65 47  BILITOT 1.7* 0.7  PROT 8.7* 6.2*  ALBUMIN 4.7 3.0*     INFECTIOUS Recent Labs  Lab 12/27/18 1201  PROCALCITON <0.10     ENDOCRINE CBG (last 3)  Recent Labs    12/25/18 0803  GLUCAP 140*         IMAGING x48h  - image(s) personally visualized  -   highlighted in bold Dg Chest Port 1 View  Result Date: 12/27/2018 CLINICAL DATA:  Increasing shortness of breath. EXAM: PORTABLE CHEST 1 VIEW COMPARISON:  12/26/2018 and prior radiographs FINDINGS: The cardiomediastinal silhouette is unremarkable. Bilateral interstitial and airspace opacities within the mid and lower lungs unchanged from yesterday. A possible trace LEFT pleural effusion noted. No pneumothorax or acute bony abnormality. IMPRESSION: Unchanged bilateral interstitial and airspace opacities suspicious for pneumonia. Possible trace LEFT pleural effusion. Electronically Signed   By: Harmon Pier M.D.   On: 12/27/2018 09:58   Dg Chest Port 1 View  Result Date: 12/26/2018 CLINICAL DATA:  Hypoxia EXAM: PORTABLE CHEST 1 VIEW COMPARISON:  Chest radiograph and chest CT December 22, 2018 FINDINGS: There is widespread airspace opacity throughout both mid and lower lung zones, most severe in the left base where there is frank consolidation. There is a small left pleural effusion. Heart size and pulmonary vascularity are normal. No adenopathy. There is aortic atherosclerosis. There is a recent appearing fracture of the posterior left fifth rib. No pneumothorax. IMPRESSION: Widespread apparent pneumonia throughout mid and lower lung zones bilaterally with the greatest degree of consolidation in the left  base. Small left pleural effusion. Stable  cardiac silhouette. No adenopathy evident. Aortic Atherosclerosis (ICD10-I70.0). Electronically Signed   By: Bretta Bang III M.D.   On: 12/26/2018 17:40     Resolved Hospital Problem list   x  Assessment & Plan:  Acute Hypoxemic Resp failure with worsening infiltrates - Low Prob for COVID. He has worsening infilrates over 5 days and hypoxemia and there is no alternative infectiohs  diagnosis and PCT is normal. THis makes it concering for COVID19 but initial history and exposure/travel is not consistent. This could also be ALI from fall and/or acute CHF Nos  Plan - recommend ID opinion on COVID testing atleast via phone) - challenge lasix and see if improes - BiPAP day time on/off and night QHS - ECHO  - check LDH/CRP / Ferritin - these non-specific markers if elevated can go with COVID  Best practice:  Diet: per [facility] Pain/Anxiety/Delirium protocol (if indicated): per triad VAP protocol (if indicated): per tiad DVT prophylaxis: per triad GI prophylaxis: per triad Glucose control: per triad Mobility: bed rest Code Status: full Family Communication: patient Disposition: SDU      ATTESTATION & SIGNATURE   The patient Roshad Hack is critically ill with multiple organ systems failure and requires high complexity decision making for assessment and support, frequent evaluation and titration of therapies, application of advanced monitoring technologies and extensive interpretation of multiple databases.   Critical Care Time devoted to patient care services described in this note is  30  Minutes. This time reflects time of care of this signee Dr Kalman Shan. This critical care time does not reflect procedure time, or teaching time or supervisory time of PA/NP/Med student/Med Resident etc but could involve care discussion time     Dr. Kalman Shan, M.D., [facility].C.P Pulmonary and Critical Care Medicine Staff Physician [facility] [place] [facility] Pager: [phone], If no answer or between  15:00h - 7:00h: call [phone]  12/27/2018 1:54 PM

## 2018-12-27 NOTE — Progress Notes (Signed)
Pt is in no distress at this time. V60 bipap placed in patients room on standby.

## 2018-12-27 NOTE — Progress Notes (Signed)
PROGRESS NOTE    Eugene Christensen  LTJ:[id number] DOB: 25-Dec-1965 DOA: 12/22/2018 PCP: [facility]    Brief Narrative:  53 y.o. male with a history of tobacco use, alcohol use, and HTN who presented to the ED with increasing dyspnea that started a few days prior, shortly after he tripped going up stairs at his parents' house, falling backwards with resultant significant left-sided chest pain with breathing. He was found to have increased work of breathing, wheezing, and continued to have hypoxia despite continuous nebs. CXR revealed right 6th rib fracture without significant PTX or infiltrate, subsequent CTA chest was performed showing developing LLL pneumonia. Due to ongoing hypoxia, admission was requested.   Assessment & Plan:   Active Problems:   Pneumonia   Multiple rib fractures   COPD with acute exacerbation (HCC)   Essential hypertension  Acute hypoxic respiratory failure with ARDS -Progressively worsening -Initially suspected from rib fx with PNA. Now in ARDS -Flu and RVP negative.  -No recent exposures suggestive of nCoV. However, resp viral panel neg, MRSA swab neg. No other identifiable source at this time - On empiric lasix empirically with 2d echo pending to rule out CHF -O2 up to 15L high flow this AM - Consulted critical care and discussed case with ID - Transferred to airborne isolation room. Cont diuresis overnight. If no significant improvement by the following day, then consideration for COVID-19 testing - Cont on empiric abx  Tobacco use:  - Plans to try not to smoke anymore.  -Cessation counseling provided this admit -Currently stable  History of alcohol use:  - No evidence of withdrawal at this time. -currently stable  BPH:  - Continue alfuzosin, finasteride - Presently stable  GERD:  - Continue PPI - remains stable at this time  Depression:  - Continue SSRI, trazodone - Presently stable  Acute R 6th rib fracture -Noted on 3/9  imaging -no pneumothorax, edema or consolidation. Reviewed -seems stable. Pain seems controlled  DVT prophylaxis: Lovenox subQ Code Status: Full Family Communication: Pt in room, family not at bedside Disposition Plan: Uncertain at this time  Consultants:   Critical Care  Discussed case with ID  Procedures:     Antimicrobials: Anti-infectives (From admission, onward)   Start     Dose/Rate Route Frequency Ordered Stop   12/22/18 1345  cefTRIAXone (ROCEPHIN) 1 g in sodium chloride 0.9 % 100 mL IVPB  Status:  Discontinued     1 g 200 mL/hr over 30 Minutes Intravenous Every 24 hours 12/22/18 1331 12/22/18 1332   12/22/18 1345  azithromycin (ZITHROMAX) 500 mg in sodium chloride 0.9 % 250 mL IVPB  Status:  Discontinued     500 mg 250 mL/hr over 60 Minutes Intravenous Every 24 hours 12/22/18 1331 12/22/18 1332   12/22/18 1245  cefTRIAXone (ROCEPHIN) 2 g in sodium chloride 0.9 % 100 mL IVPB     2 g 200 mL/hr over 30 Minutes Intravenous Every 24 hours 12/22/18 1230     12/22/18 1245  azithromycin (ZITHROMAX) 500 mg in sodium chloride 0.9 % 250 mL IVPB     500 mg 250 mL/hr over 60 Minutes Intravenous Every 24 hours 12/22/18 1230     12/22/18 1245  azithromycin (ZITHROMAX) 500 MG injection    Note to Pharmacy:  Gertie Baron   : cabinet override      12/22/18 1245 12/23/18 0059      Subjective: Increasing O2 requirements overnight  Objective: Vitals:   12/27/18 1500 12/27/18 1554 12/27/18 1600 12/27/18  1623  BP:   (!) 151/83   Pulse: 86 (!) 134 (!) 124   Resp: Temp:    98 F (36.7 C)  TempSrc:    Oral  SpO2: 95% (!) 84% 95%   Weight:      Height:        Intake/Output Summary (Last 24 hours) at 12/27/2018 1828 Last data filed at 12/27/2018 1700 Gross per 24 hour  Intake 350 ml  Output 3589 ml  Net -3239 ml   Filed Weights   12/22/18 1600 12/27/18 1040  Weight: 68 kg 62.4 kg    Examination: General exam: Awake, sitting in bed, in nad Respiratory  system: Increased respiratory effort, end-expiratory wheezing Cardiovascular system: regular rate, s1, s2 Gastrointestinal system: Soft, nondistended, positive BS Central nervous system: CN2-12 grossly intact, strength intact Extremities: Perfused, no clubbing Skin: Normal skin turgor, no notable skin lesions seen Psychiatry: Mood normal // no visual hallucinations   Data Reviewed: I have personally reviewed following labs and imaging studies  CBC: Recent Labs  Lab 12/26/18 0625 12/27/18 0553  WBC 20.9* 19.0*  HGB 13.9 13.7  HCT 43.0 40.9  MCV 100.7* 98.8  PLT 229 251   Basic Metabolic Panel: Recent Labs  Lab 12/22/18 1052 12/26/18 0625 12/27/18 0553  NA 136 134* 137  K 3.5 4.3 3.4*  CL 99 95* 95*  CO2 20* 31 29  GLUCOSE 131* 129* 121*  BUN CREATININE 0.75 0.68 0.57*  CALCIUM 9.0 8.2* 8.4*   GFR: Estimated Creatinine Clearance: 94.3 mL/min (A) (by C-G formula based on SCr of 0.57 mg/dL (L)). Liver Function Tests: Recent Labs  Lab 12/22/18 1052 12/26/18 0625  AST 32 17  ALT 14 12  ALKPHOS 65 47  BILITOT 1.7* 0.7  PROT 8.7* 6.2*  ALBUMIN 4.7 3.0*   No results for input(s): LIPASE, AMYLASE in the last 168 hours. No results for input(s): AMMONIA in the last 168 hours. Coagulation Profile: No results for input(s): INR, PROTIME in the last 168 hours. Cardiac Enzymes: Recent Labs  Lab 12/22/18 1053  TROPONINI <0.03   BNP (last 3 results) No results for input(s): PROBNP in the last 8760 hours. HbA1C: No results for input(s): HGBA1C in the last 72 hours. CBG: Recent Labs  Lab 12/25/18 0803  GLUCAP 140*   Lipid Profile: No results for input(s): CHOL, HDL, LDLCALC, TRIG, CHOLHDL, LDLDIRECT in the last 72 hours. Thyroid Function Tests: No results for input(s): TSH, T4TOTAL, FREET4, T3FREE, THYROIDAB in the last 72 hours. Anemia Panel: Recent Labs    12/27/18 1422  FERRITIN 349*   Sepsis Labs: Recent Labs  Lab 12/27/18 1201  PROCALCITON  <0.10    Recent Results (from the past 240 hour(s))  Blood Culture (routine x 2)     Status: None   Collection Time: 12/22/18 12:45 PM  Result Value Ref Range Status   Specimen Description   Final    BLOOD RIGHT ANTECUBITAL Performed at [facility], [address] [place], [place] [address]    Special Requests   Final    BOTTLES DRAWN AEROBIC AND ANAEROBIC Blood Culture adequate volume Performed at [facility], [address] [place], [place] [address]    Culture   Final    NO GROWTH 5 DAYS Performed at [facility], [address] [place], [place] [address]    Report Status 12/27/2018 FINAL  Final  Blood Culture (routine x  2)     Status: None   Collection Time: 12/22/18  1:10 PM  Result Value Ref Range Status   Specimen Description   Final    BLOOD LEFT FOREARM Performed at [facility], [address], [place], [place] [address]    Special Requests   Final    BOTTLES DRAWN AEROBIC AND ANAEROBIC Blood Culture adequate volume Performed at [facility], [address] [place], [place] [address]    Culture   Final    NO GROWTH 5 DAYS Performed at [facility], [address], [place], [place] [address]    Report Status 12/27/2018 FINAL  Final  Respiratory Panel by PCR     Status: None   Collection Time: 12/22/18  3:41 PM  Result Value Ref Range Status   Adenovirus NOT DETECTED NOT DETECTED Final   Coronavirus 229E NOT DETECTED NOT DETECTED Final    Comment: (NOTE) The Coronavirus on the Respiratory Panel, DOES NOT test for the novel  Coronavirus (2019 nCoV)    Coronavirus HKU1 NOT DETECTED NOT DETECTED Final   Coronavirus NL63 NOT DETECTED NOT DETECTED Final   Coronavirus OC43 NOT DETECTED NOT DETECTED Final   Metapneumovirus NOT DETECTED NOT DETECTED Final   Rhinovirus / Enterovirus NOT DETECTED NOT DETECTED Final   Influenza A NOT DETECTED NOT DETECTED Final   Influenza B NOT DETECTED NOT DETECTED Final    Parainfluenza Virus 1 NOT DETECTED NOT DETECTED Final   Parainfluenza Virus 2 NOT DETECTED NOT DETECTED Final   Parainfluenza Virus 3 NOT DETECTED NOT DETECTED Final   Parainfluenza Virus 4 NOT DETECTED NOT DETECTED Final   Respiratory Syncytial Virus NOT DETECTED NOT DETECTED Final   Bordetella pertussis NOT DETECTED NOT DETECTED Final   Chlamydophila pneumoniae NOT DETECTED NOT DETECTED Final   Mycoplasma pneumoniae NOT DETECTED NOT DETECTED Final    Comment: Performed at [facility], [address] [place], [place] [address]  MRSA PCR Screening     Status: None   Collection Time: 12/27/18 11:05 AM  Result Value Ref Range Status   MRSA by PCR NEGATIVE NEGATIVE Final    Comment:        The GeneXpert MRSA Assay (FDA approved for NASAL specimens only), is one component of a comprehensive MRSA colonization surveillance program. It is not intended to diagnose MRSA infection nor to guide or monitor treatment for MRSA infections. Performed at [facility], [address], [place], [place] [address]      Radiology Studies: Dg Chest Port 1 View  Result Date: 12/27/2018 CLINICAL DATA:  Increasing shortness of breath. EXAM: PORTABLE CHEST 1 VIEW COMPARISON:  12/26/2018 and prior radiographs FINDINGS: The cardiomediastinal silhouette is unremarkable. Bilateral interstitial and airspace opacities within the mid and lower lungs unchanged from yesterday. A possible trace LEFT pleural effusion noted. No pneumothorax or acute bony abnormality. IMPRESSION: Unchanged bilateral interstitial and airspace opacities suspicious for pneumonia. Possible trace LEFT pleural effusion. Electronically Signed   By: Harmon Pier M.D.   On: 12/27/2018 09:58   Dg Chest Port 1 View  Result Date: 12/26/2018 CLINICAL DATA:  Hypoxia EXAM: PORTABLE CHEST 1 VIEW COMPARISON:  Chest radiograph and chest CT December 22, 2018 FINDINGS: There is widespread airspace opacity throughout both mid and lower  lung zones, most severe in the left base where there is frank consolidation. There is a small left pleural effusion. Heart size and pulmonary vascularity are normal. No adenopathy. There is  aortic atherosclerosis. There is a recent appearing fracture of the posterior left fifth rib. No pneumothorax. IMPRESSION: Widespread apparent pneumonia throughout mid and lower lung zones bilaterally with the greatest degree of consolidation in the left base. Small left pleural effusion. Stable cardiac silhouette. No adenopathy evident. Aortic Atherosclerosis (ICD10-I70.0). Electronically Signed   By: Bretta Bang III M.D.   On: 12/26/2018 17:40    Scheduled Meds: . alfuzosin  10 mg Oral Daily  . calcium carbonate  1 tablet Oral Q breakfast  . finasteride  5 mg Oral Daily  . folic acid  1 mg Oral Daily  . furosemide  40 mg Intravenous Q12H  . guaiFENesin  600 mg Oral BID  . multivitamin with minerals  1 tablet Oral Daily  . nicotine  21 mg Transdermal Daily  . pantoprazole  40 mg Oral Daily  . sertraline  50 mg Oral Daily  . thiamine  100 mg Oral Daily   Or  . thiamine  100 mg Intravenous Daily  . traZODone  50 mg Oral QHS  . umeclidinium bromide  1 puff Inhalation Daily   Continuous Infusions: . azithromycin Stopped (12/27/18 1320)  . cefTRIAXone (ROCEPHIN)  IV Stopped (12/27/18 1450)     LOS: 5 days   Rickey Barbara, MD [facility] Pager On Amion  If 7PM-7AM, please contact night-coverage 12/27/2018, 6:28 PM

## 2018-12-27 NOTE — Significant Event (Signed)
Rapid Response Event Note  Overview: Time Called: 0910 Arrival Time: 0913 Event Type: Respiratory  Initial Focused Assessment: Pt sitting up in bed with non-rebreather on and O2 saturation 88%. Alert and oriented x4. No accessory muscle use and able to speak in full sentences without having to catch breath. O2 saturations up to 96%. Non-rebreather removed and HFNC reapplied at 15L. O2 sats remained 94%. Saturations did drop with exertion to 88% but able to recover back up to 94%.   Interventions: Stat CXR ordered and completed. Albuterol treatment given. CCM consulted. MD at bedside and wants patient transferred to SDU for PRN BiPAP. Patient transferred to 1222 via wheel chair.         Jessica Priest, RN

## 2018-12-27 NOTE — Progress Notes (Signed)
At around 0915 continuous pulse oxygen monitor noted to be alarming.  Patient noted to be satting 80% on 14L n/c.  Patient sitting up in bed, in tripod position.  Noted to be short of breath.  MD notified.  Non-rebreather mask applied.  Rapid response notified.  MD gave orders, see new orders.  He also gave order for transfer to stepdown.  Report called to [facility], RN.  Patient transferred to room 1222. Levora Angel, RN

## 2018-12-28 ENCOUNTER — Inpatient Hospital Stay (HOSPITAL_COMMUNITY): Payer: Medicare Other

## 2018-12-28 DIAGNOSIS — R0602 Shortness of breath: Secondary | ICD-10-CM

## 2018-12-28 DIAGNOSIS — R509 Fever, unspecified: Secondary | ICD-10-CM

## 2018-12-28 DIAGNOSIS — R05 Cough: Secondary | ICD-10-CM

## 2018-12-28 DIAGNOSIS — Z87311 Personal history of (healed) other pathological fracture: Secondary | ICD-10-CM

## 2018-12-28 DIAGNOSIS — R079 Chest pain, unspecified: Secondary | ICD-10-CM

## 2018-12-28 DIAGNOSIS — R000 Tachycardia, unspecified: Secondary | ICD-10-CM

## 2018-12-28 DIAGNOSIS — R0989 Other specified symptoms and signs involving the circulatory and respiratory systems: Secondary | ICD-10-CM

## 2018-12-28 DIAGNOSIS — F17200 Nicotine dependence, unspecified, uncomplicated: Secondary | ICD-10-CM

## 2018-12-28 DIAGNOSIS — J181 Lobar pneumonia, unspecified organism: Principal | ICD-10-CM

## 2018-12-28 DIAGNOSIS — R918 Other nonspecific abnormal finding of lung field: Secondary | ICD-10-CM

## 2018-12-28 LAB — COMPREHENSIVE METABOLIC PANEL
ALT: 18 U/L (ref 0–44)
AST: 24 U/L (ref 15–41)
Albumin: 3.3 g/dL — ABNORMAL LOW (ref 3.5–5.0)
Alkaline Phosphatase: 52 U/L (ref 38–126)
Anion gap: 12 (ref 5–15)
BUN: 13 mg/dL (ref 6–20)
CO2: 33 mmol/L — ABNORMAL HIGH (ref 22–32)
Calcium: 8.6 mg/dL — ABNORMAL LOW (ref 8.9–10.3)
Chloride: 89 mmol/L — ABNORMAL LOW (ref 98–111)
Creatinine, Ser: 0.68 mg/dL (ref 0.61–1.24)
GFR calc Af Amer: >60 mL/min (ref >60)
GFR calc non Af Amer: >60 mL/min (ref >60)
Glucose, Bld: 90 mg/dL (ref 70–99)
Potassium: 2.8 mmol/L — ABNORMAL LOW (ref 3.5–5.1)
Sodium: 134 mmol/L — ABNORMAL LOW (ref 135–145)
Total Bilirubin: 0.8 mg/dL (ref 0.3–1.2)
Total Protein: 7.1 g/dL (ref 6.5–8.1)

## 2018-12-28 LAB — CBC WITH DIFFERENTIAL/PLATELET
Abs Immature Granulocytes: 0.07 10*3/uL (ref 0.00–0.07)
Basophils Absolute: 0 10*3/uL (ref 0.0–0.1)
Basophils Relative: 0 %
Eosinophils Absolute: 0.1 10*3/uL (ref 0.0–0.5)
Eosinophils Relative: 1 %
HCT: 45.9 % (ref 39.0–52.0)
Hemoglobin: 14.8 g/dL (ref 13.0–17.0)
Immature Granulocytes: 1 %
Lymphocytes Relative: 14 %
Lymphs Abs: 2 10*3/uL (ref 0.7–4.0)
MCH: 32 pg (ref 26.0–34.0)
MCHC: 32.2 g/dL (ref 30.0–36.0)
MCV: 99.4 fL (ref 80.0–100.0)
Monocytes Absolute: 2.8 10*3/uL — ABNORMAL HIGH (ref 0.1–1.0)
Monocytes Relative: 19 %
Neutro Abs: 9.6 10*3/uL — ABNORMAL HIGH (ref 1.7–7.7)
Neutrophils Relative %: 65 %
Platelets: 265 10*3/uL (ref 150–400)
RBC: 4.62 MIL/uL (ref 4.22–5.81)
RDW: 12.6 % (ref 11.5–15.5)
WBC: 14.6 10*3/uL — ABNORMAL HIGH (ref 4.0–10.5)
nRBC: 0 % (ref 0.0–0.2)

## 2018-12-28 LAB — HIV ANTIBODY (ROUTINE TESTING W REFLEX): HIV Screen 4th Generation wRfx: NONREACTIVE

## 2018-12-28 LAB — LEGIONELLA PNEUMOPHILA SEROGP 1 UR AG: L. PNEUMOPHILA SEROGP 1 UR AG: NEGATIVE

## 2018-12-28 LAB — STREP PNEUMONIAE URINARY ANTIGEN: STREP PNEUMO URINARY ANTIGEN: NEGATIVE

## 2018-12-28 LAB — BRAIN NATRIURETIC PEPTIDE: B Natriuretic Peptide: 74.8 pg/mL (ref 0.0–100.0)

## 2018-12-28 MED ORDER — ORAL CARE MOUTH RINSE
15.0000 mL | Freq: Two times a day (BID) | OROMUCOSAL | Status: DC
  Administered 2018-12-28 – 2019-01-03 (×5): 15 mL via OROMUCOSAL

## 2018-12-28 MED ORDER — POTASSIUM CHLORIDE CRYS ER 20 MEQ PO TBCR
40.0000 meq | EXTENDED_RELEASE_TABLET | ORAL | Status: AC
  Administered 2018-12-28 (×2): 40 meq via ORAL
  Filled 2018-12-28 (×2): qty 2

## 2018-12-28 NOTE — Progress Notes (Signed)
Pt. Has increased confusion. MD paged.

## 2018-12-28 NOTE — Consult Note (Signed)
[facility]    Date of Admission:  12/22/2018           Day 7 ceftriaxone        Day 7 azithromycin       Reason for Consult: Progressive interstitial infiltrates    Referring Provider: Dr. Luellen Pucker  Assessment: He has progressive interstitial infiltrates after a fall.  So far diagnostic work-up has been negative.  I think he is relatively low risk for Covid-19 but agree with isolation and testing.  Plan: 1. Continue current antibiotics for now 2. Swabs for Covid-19 testing 3. Droplet/contact isolation (N 95 mask for aerosolized and procedures only)  Principal Problem:   Pneumonia Active Problems:   Multiple rib fractures   COPD with acute exacerbation (HCC)   Essential hypertension   Scheduled Meds: . alfuzosin  10 mg Oral Daily  . calcium carbonate  1 tablet Oral Q breakfast  . finasteride  5 mg Oral Daily  . folic acid  1 mg Oral Daily  . furosemide  40 mg Intravenous Q12H  . guaiFENesin  600 mg Oral BID  . mouth rinse  15 mL Mouth Rinse BID  . multivitamin with minerals  1 tablet Oral Daily  . nicotine  21 mg Transdermal Daily  . pantoprazole  40 mg Oral Daily  . sertraline  50 mg Oral Daily  . thiamine  100 mg Oral Daily   Or  . thiamine  100 mg Intravenous Daily  . traZODone  50 mg Oral QHS  . umeclidinium bromide  1 puff Inhalation Daily   Continuous Infusions: . azithromycin Stopped (12/27/18 1320)  . cefTRIAXone (ROCEPHIN)  IV Stopped (12/27/18 1450)   PRN Meds:.oxyCODONE  HPI: Eugene Christensen is a 53 y.o. 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  He has a chronic smoker's cough.  He felt like he was in his usual state of health until he fell and struck her left side of his chest on 12/19/2018.  He had sudden onset of severe pain similar to what he had experienced in the past when he had broken ribs.  He came to the ED on 12/22/2018.  The admitting note indicates that he had fever, cough and  shortness of breath.  He downplays that for me today.  He denies having any fever or chills.  He says that he had some cold sweats but he attributed that to his pain.  He says that his cough and shortness of breath were unchanged from his chronic smoker's cough.  His admission chest x-ray showed evidence of COPD but no obvious infiltrates but his CT scan revealed hazy left lower lobe infiltrate.  Serial chest x-rays over the last week show progressive interstitial opacities and he has become more hypoxic.  He has been afebrile throughout his hospitalization.  So far his microbiologic work-up has been negative.  Blood cultures are negative, respiratory virus panel was negative, influenza panel was negative, pneumococcal urinary antigen is negative.  Legionella urinary antigen, HIV antibody and echocardiography are pending.  Recently, he has been living with his parents who are well. I have traveled outside of this area recently.  He tells me that he has very little contact with people at home or at work.   Review of Systems: Review of Systems  Constitutional: Positive for diaphoresis and malaise/fatigue. Negative for chills, fever and weight loss.  HENT: Negative for congestion and sore  throat.   Respiratory: Positive for cough and shortness of breath. Negative for hemoptysis, sputum production and wheezing.   Cardiovascular: Positive for chest pain.  Gastrointestinal: Negative for abdominal pain, diarrhea, nausea and vomiting.  Genitourinary: Negative for dysuria.  Musculoskeletal: Negative for myalgias.  Skin: Negative for rash.  Neurological: Negative for headaches.    Past Medical History:  Diagnosis Date  . Hypertension     Social History   Tobacco Use  . Smoking status: Current Every Day Smoker  . Smokeless tobacco: Never Used  Substance Use Topics  . Alcohol use: Yes  . Drug use: Never    History reviewed. No pertinent family history. No Known Allergies  OBJECTIVE: Blood  pressure 132/76, pulse 94, temperature 97.9 F (36.6 C), temperature source Oral, resp. rate 20, height  (1.803 m), weight 62.4 kg, SpO2 92 %.  Physical Exam Constitutional:      Comments: He is sitting up in bed.  He is in no distress.  HENT:     Mouth/Throat:     Pharynx: No oropharyngeal exudate.  Cardiovascular:     Rate and Rhythm: Regular rhythm.     Heart sounds: No murmur.     Comments: He is tachycardic. Pulmonary:     Breath sounds: Rales present. No wheezing or rhonchi.     Comments: He is slightly tachypneic.  He desaturates easily off of oxygen. Skin:    Findings: Bruising present. No rash.  Psychiatric:        Mood and Affect: Mood normal.     Lab Results Lab Results  Component Value Date   WBC 14.6 (H) 12/28/2018   HGB 14.8 12/28/2018   HCT 45.9 12/28/2018   MCV 99.4 12/28/2018   PLT 265 12/28/2018    Lab Results  Component Value Date   CREATININE 0.68 12/28/2018   BUN 13 12/28/2018   NA 134 (L) 12/28/2018   K 2.8 (L) 12/28/2018   CL 89 (L) 12/28/2018   CO2 33 (H) 12/28/2018    Lab Results  Component Value Date   ALT 18 12/28/2018   AST 24 12/28/2018   ALKPHOS 52 12/28/2018   BILITOT 0.8 12/28/2018     Microbiology: Recent Results (from the past 240 hour(s))  Blood Culture (routine x 2)     Status: None   Collection Time: 12/22/18 12:45 PM  Result Value Ref Range Status   Specimen Description   Final    BLOOD RIGHT ANTECUBITAL Performed at [facility], [address] [place], [place] [address]    Special Requests   Final    BOTTLES DRAWN AEROBIC AND ANAEROBIC Blood Culture adequate volume Performed at [facility], [address] [place], [place] [address]    Culture   Final    NO GROWTH 5 DAYS Performed at [facility], [address], [place], [place] [address]    Report Status 12/27/2018 FINAL  Final  Blood Culture (routine x 2)     Status: None   Collection Time: 12/22/18  1:10 PM  Result  Value Ref Range Status   Specimen Description   Final    BLOOD LEFT FOREARM Performed at [facility], [address] [place], [place] [address]    Special Requests   Final    BOTTLES DRAWN AEROBIC AND ANAEROBIC Blood Culture adequate volume Performed at [facility], [address] [place], [place] [address]    Culture  Final    NO GROWTH 5 DAYS Performed at [facility], [address] [place], [place] [address]    Report Status 12/27/2018 FINAL  Final  Respiratory Panel by PCR     Status: None   Collection Time: 12/22/18  3:41 PM  Result Value Ref Range Status   Adenovirus NOT DETECTED NOT DETECTED Final   Coronavirus 229E NOT DETECTED NOT DETECTED Final    Comment: (NOTE) The Coronavirus on the Respiratory Panel, DOES NOT test for the novel  Coronavirus (2019 nCoV)    Coronavirus HKU1 NOT DETECTED NOT DETECTED Final   Coronavirus NL63 NOT DETECTED NOT DETECTED Final   Coronavirus OC43 NOT DETECTED NOT DETECTED Final   Metapneumovirus NOT DETECTED NOT DETECTED Final   Rhinovirus / Enterovirus NOT DETECTED NOT DETECTED Final   Influenza A NOT DETECTED NOT DETECTED Final   Influenza B NOT DETECTED NOT DETECTED Final   Parainfluenza Virus 1 NOT DETECTED NOT DETECTED Final   Parainfluenza Virus 2 NOT DETECTED NOT DETECTED Final   Parainfluenza Virus 3 NOT DETECTED NOT DETECTED Final   Parainfluenza Virus 4 NOT DETECTED NOT DETECTED Final   Respiratory Syncytial Virus NOT DETECTED NOT DETECTED Final   Bordetella pertussis NOT DETECTED NOT DETECTED Final   Chlamydophila pneumoniae NOT DETECTED NOT DETECTED Final   Mycoplasma pneumoniae NOT DETECTED NOT DETECTED Final    Comment: Performed at [facility], [address] [place], [place] [address]  MRSA PCR Screening     Status: None   Collection Time: 12/27/18 11:05 AM  Result Value Ref Range Status   MRSA by PCR NEGATIVE NEGATIVE Final    Comment:        The GeneXpert MRSA Assay (FDA  approved for NASAL specimens only), is one component of a comprehensive MRSA colonization surveillance program. It is not intended to diagnose MRSA infection nor to guide or monitor treatment for MRSA infections. Performed at [facility], [address], [place], [place] [address]     Cliffton Asters, MD [facility] [phone] pager   [phone] cell 12/28/2018, 10:12 AM

## 2018-12-28 NOTE — Progress Notes (Signed)
Pt had morning labs drawn, and potassium came back at 2.8. RN paged midlevel to make them aware. Awaiting orders, will continue to monitor.

## 2018-12-28 NOTE — Progress Notes (Signed)
PROGRESS NOTE    Eugene Christensen  GNO:[id number] DOB: Dec 20, 1965 DOA: 12/22/2018 PCP: [facility]    Brief Narrative:  53 y.o. male with a history of tobacco use, alcohol use, and HTN who presented to the ED with increasing dyspnea that started a few days prior, shortly after he tripped going up stairs at his parents' house, falling backwards with resultant significant left-sided chest pain with breathing. He was found to have increased work of breathing, wheezing, and continued to have hypoxia despite continuous nebs. CXR revealed right 6th rib fracture without significant PTX or infiltrate, subsequent CTA chest was performed showing developing LLL pneumonia. Due to ongoing hypoxia, admission was requested.   Assessment & Plan:   Principal Problem:   Pneumonia Active Problems:   Multiple rib fractures   COPD with acute exacerbation (HCC)   Essential hypertension  Acute hypoxic respiratory failure with ARDS -Progressively worsening -Initially suspected from rib fx with PNA. Now in ARDS -Flu and RVP negative.  -No recent exposures initially suggestive of nCoV. However, resp viral panel neg, MRSA swab neg. No other identifiable source at this time - Given IV lasix with good diuresis however patient remains markedly hypoxemic -Presently remains on NRB with high flow O2 -This AM, pt speaking in full sentences, no accessory muscle use, alert and oriented - Appreciate assistance by PCCM -ID consulted and following. Recommendation for formal COVID-19 testing - Continue airborne isolation per protocol. Pending nasal swab result  Tobacco use:  - Plans to try not to smoke anymore.  -Cessation counseling provided this admit -Presently stable  History of alcohol use:  - No evidence of withdrawal at this time. - Presently stable  BPH:  - Continue alfuzosin, finasteride - Presently stable  GERD:  - Continue PPI - Stable currently  Depression:  - Continue SSRI,  trazodone - Currently stable  Acute R 6th rib fracture -Noted on 3/9 imaging -no pneumothorax, edema or consolidation. Reviewed -Presently stable. Pain seems controlled  DVT prophylaxis: Lovenox subQ Code Status: Full Family Communication: Pt in room, family not at bedside Disposition Plan: Uncertain at this time  Consultants:   Critical Care  Discussed case with ID  Procedures:     Antimicrobials: Anti-infectives (From admission, onward)   Start     Dose/Rate Route Frequency Ordered Stop   12/22/18 1345  cefTRIAXone (ROCEPHIN) 1 g in sodium chloride 0.9 % 100 mL IVPB  Status:  Discontinued     1 g 200 mL/hr over 30 Minutes Intravenous Every 24 hours 12/22/18 1331 12/22/18 1332   12/22/18 1345  azithromycin (ZITHROMAX) 500 mg in sodium chloride 0.9 % 250 mL IVPB  Status:  Discontinued     500 mg 250 mL/hr over 60 Minutes Intravenous Every 24 hours 12/22/18 1331 12/22/18 1332   12/22/18 1245  cefTRIAXone (ROCEPHIN) 2 g in sodium chloride 0.9 % 100 mL IVPB     2 g 200 mL/hr over 30 Minutes Intravenous Every 24 hours 12/22/18 1230     12/22/18 1245  azithromycin (ZITHROMAX) 500 mg in sodium chloride 0.9 % 250 mL IVPB     500 mg 250 mL/hr over 60 Minutes Intravenous Every 24 hours 12/22/18 1230     12/22/18 1245  azithromycin (ZITHROMAX) 500 MG injection    Note to Pharmacy:  Gertie Baron   : cabinet override      12/22/18 1245 12/23/18 0059      Subjective: Denies feeling any more sob although pt has required increasing O2  Objective: Vitals:  12/28/18 1200 12/28/18 1239 12/28/18 1246 12/28/18 1256  BP: (!) 134/91     Pulse: (!) 105     Resp:  19 (!) 26 19  Temp:  98.4 F (36.9 C)    TempSrc:  Oral    SpO2:  94% (!) 85% 98%  Weight:      Height:        Intake/Output Summary (Last 24 hours) at 12/28/2018 1536 Last data filed at 12/28/2018 1100 Gross per 24 hour  Intake -  Output 2675 ml  Net -2675 ml   Filed Weights   12/22/18 1600 12/27/18 1040   Weight: 68 kg 62.4 kg    Examination: General exam: Conversant, in no acute distress Respiratory system: normal chest rise, clear, no audible wheezing Cardiovascular system: regular rhythm, s1-s2 Gastrointestinal system: Nondistended, nontender, pos BS Central nervous system: No seizures, no tremors Extremities: No cyanosis, no joint deformities Skin: No rashes, no pallor, cyanotic Psychiatry: Affect normal // no auditory hallucinations   Data Reviewed: I have personally reviewed following labs and imaging studies  CBC: Recent Labs  Lab 12/26/18 0625 12/27/18 0553 12/28/18 0332  WBC 20.9* 19.0* 14.6*  NEUTROABS  --   --  9.6*  HGB 13.9 13.7 14.8  HCT 43.0 40.9 45.9  MCV 100.7* 98.8 99.4  PLT 229 251 265   Basic Metabolic Panel: Recent Labs  Lab 12/22/18 1052 12/26/18 0625 12/27/18 0553 12/28/18 0332  NA 136 134* 137 134*  K 3.5 4.3 3.4* 2.8*  CL 99 95* 95* 89*  CO2 20* 31 29 33*  GLUCOSE 131* 129* 121* 90  BUN 14 10 10 13   CREATININE 0.75 0.68 0.57* 0.68  CALCIUM 9.0 8.2* 8.4* 8.6*   GFR: Estimated Creatinine Clearance: 94.3 mL/min (by C-G formula based on SCr of 0.68 mg/dL). Liver Function Tests: Recent Labs  Lab 12/22/18 1052 12/26/18 0625 12/28/18 0332  AST 32 17 24  ALT 14 12 18   ALKPHOS 65 47 52  BILITOT 1.7* 0.7 0.8  PROT 8.7* 6.2* 7.1  ALBUMIN 4.7 3.0* 3.3*   No results for input(s): LIPASE, AMYLASE in the last 168 hours. No results for input(s): AMMONIA in the last 168 hours. Coagulation Profile: No results for input(s): INR, PROTIME in the last 168 hours. Cardiac Enzymes: Recent Labs  Lab 12/22/18 1053  TROPONINI <0.03   BNP (last 3 results) No results for input(s): PROBNP in the last 8760 hours. HbA1C: No results for input(s): HGBA1C in the last 72 hours. CBG: Recent Labs  Lab 12/25/18 0803  GLUCAP 140*   Lipid Profile: No results for input(s): CHOL, HDL, LDLCALC, TRIG, CHOLHDL, LDLDIRECT in the last 72 hours. Thyroid  Function Tests: No results for input(s): TSH, T4TOTAL, FREET4, T3FREE, THYROIDAB in the last 72 hours. Anemia Panel: Recent Labs    12/27/18 1422  FERRITIN 349*   Sepsis Labs: Recent Labs  Lab 12/27/18 1201  PROCALCITON <0.10    Recent Results (from the past 240 hour(s))  Blood Culture (routine x 2)     Status: None   Collection Time: 12/22/18 12:45 PM  Result Value Ref Range Status   Specimen Description   Final    BLOOD RIGHT ANTECUBITAL Performed at [facility], [address] [place], [place] [address]    Special Requests   Final    BOTTLES DRAWN AEROBIC AND ANAEROBIC Blood Culture adequate volume Performed at [facility], [address] [place], [place] [address]    Culture  Final    NO GROWTH 5 DAYS Performed at [facility], [address] [place], [place] [address]    Report Status 12/27/2018 FINAL  Final  Blood Culture (routine x 2)     Status: None   Collection Time: 12/22/18  1:10 PM  Result Value Ref Range Status   Specimen Description   Final    BLOOD LEFT FOREARM Performed at [facility], [address], [place], [place] [address]    Special Requests   Final    BOTTLES DRAWN AEROBIC AND ANAEROBIC Blood Culture adequate volume Performed at [facility], [address] [place], [place] [address]    Culture   Final    NO GROWTH 5 DAYS Performed at [facility], [address] [place], [place] [address]    Report Status 12/27/2018 FINAL  Final  Respiratory Panel by PCR     Status: None   Collection Time: 12/22/18  3:41 PM  Result Value Ref Range Status   Adenovirus NOT DETECTED NOT DETECTED Final   Coronavirus 229E NOT DETECTED NOT DETECTED Final    Comment: (NOTE) The Coronavirus on the Respiratory Panel, DOES NOT test for the novel  Coronavirus (2019 nCoV)    Coronavirus HKU1 NOT DETECTED NOT DETECTED Final   Coronavirus NL63 NOT DETECTED NOT DETECTED Final   Coronavirus OC43 NOT  DETECTED NOT DETECTED Final   Metapneumovirus NOT DETECTED NOT DETECTED Final   Rhinovirus / Enterovirus NOT DETECTED NOT DETECTED Final   Influenza A NOT DETECTED NOT DETECTED Final   Influenza B NOT DETECTED NOT DETECTED Final   Parainfluenza Virus 1 NOT DETECTED NOT DETECTED Final   Parainfluenza Virus 2 NOT DETECTED NOT DETECTED Final   Parainfluenza Virus 3 NOT DETECTED NOT DETECTED Final   Parainfluenza Virus 4 NOT DETECTED NOT DETECTED Final   Respiratory Syncytial Virus NOT DETECTED NOT DETECTED Final   Bordetella pertussis NOT DETECTED NOT DETECTED Final   Chlamydophila pneumoniae NOT DETECTED NOT DETECTED Final   Mycoplasma pneumoniae NOT DETECTED NOT DETECTED Final    Comment: Performed at [facility], [address], [place], [place] [address]  MRSA PCR Screening     Status: None   Collection Time: 12/27/18 11:05 AM  Result Value Ref Range Status   MRSA by PCR NEGATIVE NEGATIVE Final    Comment:        The GeneXpert MRSA Assay (FDA approved for NASAL specimens only), is one component of a comprehensive MRSA colonization surveillance program. It is not intended to diagnose MRSA infection nor to guide or monitor treatment for MRSA infections. Performed at [facility], [address] [place], [place] [address]   Culture, blood (routine x 2) Call MD if unable to obtain prior to antibiotics being given     Status: None (Preliminary result)   Collection Time: 12/27/18 11:40 AM  Result Value Ref Range Status   Specimen Description   Final    BLOOD LEFT ARM Performed at [facility], [address], [place], [place] [address]    Special Requests   Final    BOTTLES DRAWN AEROBIC ONLY Blood Culture adequate volume Performed at [facility], [address] [place], [place] [address]    Culture   Final    NO GROWTH < 24 HOURS Performed at [facility], [address], [place], [place] [address]     Report Status PENDING  Incomplete  Culture, blood (routine x 2) Call MD if unable to obtain prior to antibiotics being given     Status: None (Preliminary result)   Collection Time: 12/27/18 11:40 AM  Result Value Ref Range Status   Specimen Description   Final    BLOOD RIGHT ARM Performed at [facility], [address] [place], [place] [address]    Special Requests   Final    BOTTLES DRAWN AEROBIC ONLY Blood Culture results may not be optimal due to an inadequate volume of blood received in culture bottles Performed at [facility], [address] [place], [place] [address]    Culture   Final    NO GROWTH < 24 HOURS Performed at [facility], [address], [place], [place] [address]    Report Status PENDING  Incomplete     Radiology Studies: Dg Chest Port 1 View  Result Date: 12/27/2018 CLINICAL DATA:  Increasing shortness of breath. EXAM: PORTABLE CHEST 1 VIEW COMPARISON:  12/26/2018 and prior radiographs FINDINGS: The cardiomediastinal silhouette is unremarkable. Bilateral interstitial and airspace opacities within the mid and lower lungs unchanged from yesterday. A possible trace LEFT pleural effusion noted. No pneumothorax or acute bony abnormality. IMPRESSION: Unchanged bilateral interstitial and airspace opacities suspicious for pneumonia. Possible trace LEFT pleural effusion. Electronically Signed   By: Harmon Pier M.D.   On: 12/27/2018 09:58   Dg Chest Port 1 View  Result Date: 12/26/2018 CLINICAL DATA:  Hypoxia EXAM: PORTABLE CHEST 1 VIEW COMPARISON:  Chest radiograph and chest CT December 22, 2018 FINDINGS: There is widespread airspace opacity throughout both mid and lower lung zones, most severe in the left base where there is frank consolidation. There is a small left pleural effusion. Heart size and pulmonary vascularity are normal. No adenopathy. There is aortic atherosclerosis. There is a recent appearing fracture of the posterior  left fifth rib. No pneumothorax. IMPRESSION: Widespread apparent pneumonia throughout mid and lower lung zones bilaterally with the greatest degree of consolidation in the left base. Small left pleural effusion. Stable cardiac silhouette. No adenopathy evident. Aortic Atherosclerosis (ICD10-I70.0). Electronically Signed   By: Bretta Bang III M.D.   On: 12/26/2018 17:40    Scheduled Meds: . alfuzosin  10 mg Oral Daily  . calcium carbonate  1 tablet Oral Q breakfast  . finasteride  5 mg Oral Daily  . folic acid  1 mg Oral Daily  . furosemide  40 mg Intravenous Q12H  . guaiFENesin  600 mg Oral BID  . mouth rinse  15 mL Mouth Rinse BID  . multivitamin with minerals  1 tablet Oral Daily  . nicotine  21 mg Transdermal Daily  . pantoprazole  40 mg Oral Daily  . sertraline  50 mg Oral Daily  . thiamine  100 mg Oral Daily   Or  . thiamine  100 mg Intravenous Daily  . traZODone  50 mg Oral QHS  . umeclidinium bromide  1 puff Inhalation Daily   Continuous Infusions: . azithromycin 500 mg (12/28/18 1421)  . cefTRIAXone (ROCEPHIN)  IV Stopped (12/28/18 1314)     LOS: 6 days   Rickey Barbara, MD [facility] Pager On Amion  If 7PM-7AM, please contact night-coverage 12/28/2018, 3:36 PM

## 2018-12-29 ENCOUNTER — Inpatient Hospital Stay (HOSPITAL_COMMUNITY): Payer: Medicare Other

## 2018-12-29 ENCOUNTER — Other Ambulatory Visit (HOSPITAL_COMMUNITY): Payer: Medicare Other

## 2018-12-29 DIAGNOSIS — J189 Pneumonia, unspecified organism: Secondary | ICD-10-CM

## 2018-12-29 DIAGNOSIS — W19XXXA Unspecified fall, initial encounter: Secondary | ICD-10-CM

## 2018-12-29 DIAGNOSIS — J441 Chronic obstructive pulmonary disease with (acute) exacerbation: Secondary | ICD-10-CM

## 2018-12-29 DIAGNOSIS — S2239XA Fracture of one rib, unspecified side, initial encounter for closed fracture: Secondary | ICD-10-CM

## 2018-12-29 DIAGNOSIS — I499 Cardiac arrhythmia, unspecified: Secondary | ICD-10-CM

## 2018-12-29 DIAGNOSIS — R0689 Other abnormalities of breathing: Secondary | ICD-10-CM

## 2018-12-29 DIAGNOSIS — J449 Chronic obstructive pulmonary disease, unspecified: Secondary | ICD-10-CM

## 2018-12-29 LAB — COMPREHENSIVE METABOLIC PANEL
ALT: 16 U/L (ref 0–44)
AST: 19 U/L (ref 15–41)
Albumin: 3.1 g/dL — ABNORMAL LOW (ref 3.5–5.0)
Alkaline Phosphatase: 51 U/L (ref 38–126)
Anion gap: 15 (ref 5–15)
BUN: 16 mg/dL (ref 6–20)
CO2: 28 mmol/L (ref 22–32)
Calcium: 8.6 mg/dL — ABNORMAL LOW (ref 8.9–10.3)
Chloride: 87 mmol/L — ABNORMAL LOW (ref 98–111)
Creatinine, Ser: 0.6 mg/dL — ABNORMAL LOW (ref 0.61–1.24)
GFR calc Af Amer: >60 mL/min (ref >60)
GFR calc non Af Amer: >60 mL/min (ref >60)
Glucose, Bld: 91 mg/dL (ref 70–99)
Potassium: 2.9 mmol/L — ABNORMAL LOW (ref 3.5–5.1)
Sodium: 130 mmol/L — ABNORMAL LOW (ref 135–145)
Total Bilirubin: 1.2 mg/dL (ref 0.3–1.2)
Total Protein: 6.8 g/dL (ref 6.5–8.1)

## 2018-12-29 LAB — CBC
HEMATOCRIT: 42.1 % (ref 39.0–52.0)
Hemoglobin: 14.3 g/dL (ref 13.0–17.0)
MCH: 32.6 pg (ref 26.0–34.0)
MCHC: 34 g/dL (ref 30.0–36.0)
MCV: 95.9 fL (ref 80.0–100.0)
Platelets: 294 10*3/uL (ref 150–400)
RBC: 4.39 MIL/uL (ref 4.22–5.81)
RDW: 12.5 % (ref 11.5–15.5)
WBC: 13.5 10*3/uL — ABNORMAL HIGH (ref 4.0–10.5)
nRBC: 0 % (ref 0.0–0.2)

## 2018-12-29 LAB — ECHOCARDIOGRAM COMPLETE
Height: 71 in
Weight: 2201.07 oz

## 2018-12-29 LAB — MAGNESIUM: Magnesium: 1.8 mg/dL (ref 1.7–2.4)

## 2018-12-29 MED ORDER — CHLORHEXIDINE GLUCONATE CLOTH 2 % EX PADS
6.0000 | MEDICATED_PAD | Freq: Every day | CUTANEOUS | Status: DC
  Administered 2018-12-30 – 2018-12-31 (×2): 6 via TOPICAL

## 2018-12-29 MED ORDER — IPRATROPIUM-ALBUTEROL 0.5-2.5 (3) MG/3ML IN SOLN
RESPIRATORY_TRACT | Status: AC
  Filled 2018-12-29: qty 3

## 2018-12-29 MED ORDER — POTASSIUM CHLORIDE CRYS ER 20 MEQ PO TBCR
40.0000 meq | EXTENDED_RELEASE_TABLET | Freq: Two times a day (BID) | ORAL | Status: AC
  Administered 2018-12-29 (×2): 40 meq via ORAL
  Filled 2018-12-29 (×2): qty 2

## 2018-12-29 MED ORDER — MAGNESIUM SULFATE 2 GM/50ML IV SOLN
2.0000 g | Freq: Once | INTRAVENOUS | Status: AC
  Administered 2018-12-29: 2 g via INTRAVENOUS
  Filled 2018-12-29: qty 50

## 2018-12-29 MED ORDER — POLYETHYLENE GLYCOL 3350 17 G PO PACK
17.0000 g | PACK | Freq: Every day | ORAL | Status: DC
  Administered 2018-12-29 – 2018-12-30 (×2): 17 g via ORAL
  Filled 2018-12-29 (×6): qty 1

## 2018-12-29 MED ORDER — IPRATROPIUM-ALBUTEROL 0.5-2.5 (3) MG/3ML IN SOLN
3.0000 mL | RESPIRATORY_TRACT | Status: DC
  Administered 2018-12-29 (×3): 3 mL via RESPIRATORY_TRACT
  Filled 2018-12-29 (×2): qty 3

## 2018-12-29 MED ORDER — POTASSIUM CHLORIDE 10 MEQ/100ML IV SOLN
10.0000 meq | INTRAVENOUS | Status: DC
  Administered 2018-12-29 (×2): 10 meq via INTRAVENOUS
  Filled 2018-12-29 (×3): qty 100

## 2018-12-29 NOTE — Progress Notes (Signed)
Pt morning labs showed that he has a potassium level of 2.9. RN paged MD C. Woods to make them aware. Awaiting orders/response, will continue to monitor.

## 2018-12-29 NOTE — Progress Notes (Signed)
PROGRESS NOTE    Eugene Christensen  NUU:[id number] DOB: 03-Dec-1965 DOA: 12/22/2018 PCP: [facility]    Brief Narrative:  53 y.o. male with a history of tobacco use, alcohol use, and HTN who presented to the ED with increasing dyspnea that started a few days prior, shortly after he tripped going up stairs at his parents' house, falling backwards with resultant significant left-sided chest pain with breathing. He was found to have increased work of breathing, wheezing, and continued to have hypoxia despite continuous nebs. CXR revealed right 6th rib fracture without significant PTX or infiltrate, subsequent CTA chest was performed showing developing LLL pneumonia. Due to ongoing hypoxia, admission was requested.   Assessment & Plan:   Principal Problem:   Community acquired pneumonia of left lower lobe of lung (HCC) Active Problems:   Multiple rib fractures   COPD with acute exacerbation (HCC)   Essential hypertension  Acute hypoxic respiratory failure with ARDS -Progressively worsening -Initially suspected from rib fx with PNA. Now in ARDS -Flu and RVP negative.  -No recent exposures initially suggestive of nCoV. However, resp viral panel neg, MRSA swab neg. No other identifiable source at this time - Given IV lasix with good diuresis however patient remains markedly hypoxemic -Presently remains on NRB with high flow O2 -This AM, pt speaking in full sentences, no accessory muscle use, alert and oriented - Appreciate assistance by PCCM -ID consulted and following. Recommendation for formal COVID-19 testing - Continue airborne isolation per protocol. -Per ID, COVID-19 result anticipated around 3/19. Unlikely chance of pos result  Tobacco use:  - Plans to try not to smoke anymore.  -Cessation counseling provided this admit -Currently stable  History of alcohol use:  - No evidence of withdrawal at this time. - currently stable  BPH:  - Continue alfuzosin, finasteride -  Currently stable  GERD:  - Continue PPI - Stable currently  Depression:  - Continue SSRI, trazodone - Presently stable  Acute R 6th rib fracture -Noted on 3/9 imaging -no pneumothorax, edema or consolidation. Reviewed -Currently stable.  DVT prophylaxis: Lovenox subQ Code Status: Full Family Communication: Pt in room, family not at bedside Disposition Plan: Uncertain at this time  Consultants:   Critical Care  Discussed case with ID  Procedures:     Antimicrobials: Anti-infectives (From admission, onward)   Start     Dose/Rate Route Frequency Ordered Stop   12/22/18 1345  cefTRIAXone (ROCEPHIN) 1 g in sodium chloride 0.9 % 100 mL IVPB  Status:  Discontinued     1 g 200 mL/hr over 30 Minutes Intravenous Every 24 hours 12/22/18 1331 12/22/18 1332   12/22/18 1345  azithromycin (ZITHROMAX) 500 mg in sodium chloride 0.9 % 250 mL IVPB  Status:  Discontinued     500 mg 250 mL/hr over 60 Minutes Intravenous Every 24 hours 12/22/18 1331 12/22/18 1332   12/22/18 1245  cefTRIAXone (ROCEPHIN) 2 g in sodium chloride 0.9 % 100 mL IVPB     2 g 200 mL/hr over 30 Minutes Intravenous Every 24 hours 12/22/18 1230     12/22/18 1245  azithromycin (ZITHROMAX) 500 mg in sodium chloride 0.9 % 250 mL IVPB     500 mg 250 mL/hr over 60 Minutes Intravenous Every 24 hours 12/22/18 1230     12/22/18 1245  azithromycin (ZITHROMAX) 500 MG injection    Note to Pharmacy:  Gertie Baron   : cabinet override      12/22/18 1245 12/23/18 0059      Subjective:  No requiring less O2 requirement  Objective: Vitals:   12/29/18 1058 12/29/18 1140 12/29/18 1200 12/29/18 1400  BP:   (!) 128/101 134/89  Pulse:      Resp: 19  (!) 21 16  Temp:  98.3 F (36.8 C)    TempSrc:  Axillary    SpO2: 100%  (!) 87% 91%  Weight:      Height:        Intake/Output Summary (Last 24 hours) at 12/29/2018 1611 Last data filed at 12/29/2018 0900 Gross per 24 hour  Intake 882.93 ml  Output 1900 ml  Net -1017.07  ml   Filed Weights   12/22/18 1600 12/27/18 1040  Weight: 68 kg 62.4 kg    Examination: General exam: Awake, sitting up in bed, in nad Respiratory system: Normal respiratory effort Cardiovascular system: perfused   Data Reviewed: I have personally reviewed following labs and imaging studies  CBC: Recent Labs  Lab 12/26/18 0625 12/27/18 0553 12/28/18 0332 12/29/18 0418  WBC 20.9* 19.0* 14.6* 13.5*  NEUTROABS  --   --  9.6*  --   HGB 13.9 13.7 14.8 14.3  HCT 43.0 40.9 45.9 42.1  MCV 100.7* 98.8 99.4 95.9  PLT 229 251 265 294   Basic Metabolic Panel: Recent Labs  Lab 12/26/18 0625 12/27/18 0553 12/28/18 0332 12/29/18 0418  NA 134* 137 134* 130*  K 4.3 3.4* 2.8* 2.9*  CL 95* 95* 89* 87*  CO2 31 29 33* 28  GLUCOSE 129* 121* 90 91  BUN CREATININE 0.68 0.57* 0.68 0.60*  CALCIUM 8.2* 8.4* 8.6* 8.6*  MG  --   --   --  1.8   GFR: Estimated Creatinine Clearance: 94.3 mL/min (A) (by C-G formula based on SCr of 0.6 mg/dL (L)). Liver Function Tests: Recent Labs  Lab 12/26/18 0625 12/28/18 0332 12/29/18 0418  AST ALT ALKPHOS 47 52 51  BILITOT 0.7 0.8 1.2  PROT 6.2* 7.1 6.8  ALBUMIN 3.0* 3.3* 3.1*   No results for input(s): LIPASE, AMYLASE in the last 168 hours. No results for input(s): AMMONIA in the last 168 hours. Coagulation Profile: No results for input(s): INR, PROTIME in the last 168 hours. Cardiac Enzymes: No results for input(s): CKTOTAL, CKMB, CKMBINDEX, TROPONINI in the last 168 hours. BNP (last 3 results) No results for input(s): PROBNP in the last 8760 hours. HbA1C: No results for input(s): HGBA1C in the last 72 hours. CBG: Recent Labs  Lab 12/25/18 0803  GLUCAP 140*   Lipid Profile: No results for input(s): CHOL, HDL, LDLCALC, TRIG, CHOLHDL, LDLDIRECT in the last 72 hours. Thyroid Function Tests: No results for input(s): TSH, T4TOTAL, FREET4, T3FREE, THYROIDAB in the last 72 hours. Anemia Panel: Recent  Labs    12/27/18 1422  FERRITIN 349*   Sepsis Labs: Recent Labs  Lab 12/27/18 1201  PROCALCITON <0.10    Recent Results (from the past 240 hour(s))  Blood Culture (routine x 2)     Status: None   Collection Time: 12/22/18 12:45 PM  Result Value Ref Range Status   Specimen Description   Final    BLOOD RIGHT ANTECUBITAL Performed at [facility], [address] [place], [place] [address]    Special Requests   Final    BOTTLES DRAWN AEROBIC AND ANAEROBIC Blood Culture adequate volume Performed at [facility], [address], [place], [place] [address]    Culture  Final    NO GROWTH 5 DAYS Performed at [facility], [address] [place], [place] [address]    Report Status 12/27/2018 FINAL  Final  Blood Culture (routine x 2)     Status: None   Collection Time: 12/22/18  1:10 PM  Result Value Ref Range Status   Specimen Description   Final    BLOOD LEFT FOREARM Performed at [facility], [address] [place], [place] [address]    Special Requests   Final    BOTTLES DRAWN AEROBIC AND ANAEROBIC Blood Culture adequate volume Performed at [facility], [address] [place], [place] [address]    Culture   Final    NO GROWTH 5 DAYS Performed at [facility], [address] [place], [place] [address]    Report Status 12/27/2018 FINAL  Final  Respiratory Panel by PCR     Status: None   Collection Time: 12/22/18  3:41 PM  Result Value Ref Range Status   Adenovirus NOT DETECTED NOT DETECTED Final   Coronavirus 229E NOT DETECTED NOT DETECTED Final    Comment: (NOTE) The Coronavirus on the Respiratory Panel, DOES NOT test for the novel  Coronavirus (2019 nCoV)    Coronavirus HKU1 NOT DETECTED NOT DETECTED Final   Coronavirus NL63 NOT DETECTED NOT DETECTED Final   Coronavirus OC43 NOT DETECTED NOT DETECTED Final   Metapneumovirus NOT DETECTED NOT DETECTED Final   Rhinovirus / Enterovirus NOT DETECTED NOT DETECTED  Final   Influenza A NOT DETECTED NOT DETECTED Final   Influenza B NOT DETECTED NOT DETECTED Final   Parainfluenza Virus 1 NOT DETECTED NOT DETECTED Final   Parainfluenza Virus 2 NOT DETECTED NOT DETECTED Final   Parainfluenza Virus 3 NOT DETECTED NOT DETECTED Final   Parainfluenza Virus 4 NOT DETECTED NOT DETECTED Final   Respiratory Syncytial Virus NOT DETECTED NOT DETECTED Final   Bordetella pertussis NOT DETECTED NOT DETECTED Final   Chlamydophila pneumoniae NOT DETECTED NOT DETECTED Final   Mycoplasma pneumoniae NOT DETECTED NOT DETECTED Final    Comment: Performed at [facility], [address] [place], [place] [address]  MRSA PCR Screening     Status: None   Collection Time: 12/27/18 11:05 AM  Result Value Ref Range Status   MRSA by PCR NEGATIVE NEGATIVE Final    Comment:        The GeneXpert MRSA Assay (FDA approved for NASAL specimens only), is one component of a comprehensive MRSA colonization surveillance program. It is not intended to diagnose MRSA infection nor to guide or monitor treatment for MRSA infections. Performed at [facility], [address], [place], [place] [address]   Culture, blood (routine x 2) Call MD if unable to obtain prior to antibiotics being given     Status: None (Preliminary result)   Collection Time: 12/27/18 11:40 AM  Result Value Ref Range Status   Specimen Description   Final    BLOOD LEFT ARM Performed at [facility], [address] [place], [place] [address]    Special Requests   Final    BOTTLES DRAWN AEROBIC ONLY Blood Culture adequate volume Performed at [facility], [address] [place], [place] [address]    Culture   Final    NO GROWTH 2 DAYS Performed at [facility], [address] [place], [place] [address]    Report Status PENDING  Incomplete  Culture,  blood (routine x 2) Call MD if unable to obtain prior to antibiotics being given     Status: None  (Preliminary result)   Collection Time: 12/27/18 11:40 AM  Result Value Ref Range Status   Specimen Description   Final    BLOOD RIGHT ARM Performed at [facility], [address], [place], [place] [address]    Special Requests   Final    BOTTLES DRAWN AEROBIC ONLY Blood Culture results may not be optimal due to an inadequate volume of blood received in culture bottles Performed at [facility], [address] [place], [place] [address]    Culture   Final    NO GROWTH 2 DAYS Performed at [facility], [address], [place], [place] [address]    Report Status PENDING  Incomplete     Radiology Studies: Dg Chest Port 1 View  Result Date: 12/29/2018 CLINICAL DATA:  Pulmonary infiltrates. EXAM: PORTABLE CHEST 1 VIEW COMPARISON:  12/27/2018 FINDINGS: The cardiomediastinal silhouette is unchanged with normal heart size. The lungs remain hyperinflated. Interstitial and airspace opacities in the mid and lower lungs bilaterally have slightly improved, most notably in the mid lungs. No sizable pleural effusion or pneumothorax is identified. No acute osseous abnormality is seen. IMPRESSION: Slightly improved bilateral lung opacities compatible with pneumonia. Electronically Signed   By: Sebastian Ache M.D.   On: 12/29/2018 10:18    Scheduled Meds: . alfuzosin  10 mg Oral Daily  . calcium carbonate  1 tablet Oral Q breakfast  . finasteride  5 mg Oral Daily  . folic acid  1 mg Oral Daily  . furosemide  40 mg Intravenous Q12H  . guaiFENesin  600 mg Oral BID  . ipratropium-albuterol  3 mL Nebulization Q4H while awake  . mouth rinse  15 mL Mouth Rinse BID  . multivitamin with minerals  1 tablet Oral Daily  . nicotine  21 mg Transdermal Daily  . pantoprazole  40 mg Oral Daily  . polyethylene glycol  17 g Oral Daily  . potassium chloride  40 mEq Oral BID  . sertraline  50 mg Oral Daily  . thiamine  100 mg Oral Daily   Or  . thiamine  100 mg Intravenous Daily  .  traZODone  50 mg Oral QHS   Continuous Infusions: . azithromycin Stopped (12/29/18 1255)  . cefTRIAXone (ROCEPHIN)  IV Stopped (12/29/18 1414)     LOS: 7 days   Rickey Barbara, MD [facility] Pager On Amion  If 7PM-7AM, please contact night-coverage 12/29/2018, 4:11 PM

## 2018-12-29 NOTE — Progress Notes (Signed)
INFECTIOUS DISEASE PROGRESS NOTE  ID: Eugene Christensen is a 53 y.o. male with  Principal Problem:   Pneumonia Active Problems:   Multiple rib fractures   COPD with acute exacerbation (HCC)   Essential hypertension  Subjective: Breathing better.   Abtx:  Anti-infectives (From admission, onward)   Start     Dose/Rate Route Frequency Ordered Stop   12/22/18 1345  cefTRIAXone (ROCEPHIN) 1 g in sodium chloride 0.9 % 100 mL IVPB  Status:  Discontinued     1 g 200 mL/hr over 30 Minutes Intravenous Every 24 hours 12/22/18 1331 12/22/18 1332   12/22/18 1345  azithromycin (ZITHROMAX) 500 mg in sodium chloride 0.9 % 250 mL IVPB  Status:  Discontinued     500 mg 250 mL/hr over 60 Minutes Intravenous Every 24 hours 12/22/18 1331 12/22/18 1332   12/22/18 1245  cefTRIAXone (ROCEPHIN) 2 g in sodium chloride 0.9 % 100 mL IVPB     2 g 200 mL/hr over 30 Minutes Intravenous Every 24 hours 12/22/18 1230     12/22/18 1245  azithromycin (ZITHROMAX) 500 mg in sodium chloride 0.9 % 250 mL IVPB     500 mg 250 mL/hr over 60 Minutes Intravenous Every 24 hours 12/22/18 1230     12/22/18 1245  azithromycin (ZITHROMAX) 500 MG injection    Note to Pharmacy:  Gertie Baron   : cabinet override      12/22/18 1245 12/23/18 0059      Medications:  Scheduled: . alfuzosin  10 mg Oral Daily  . calcium carbonate  1 tablet Oral Q breakfast  . finasteride  5 mg Oral Daily  . folic acid  1 mg Oral Daily  . furosemide  40 mg Intravenous Q12H  . guaiFENesin  600 mg Oral BID  . ipratropium-albuterol  3 mL Nebulization Q4H while awake  . mouth rinse  15 mL Mouth Rinse BID  . multivitamin with minerals  1 tablet Oral Daily  . nicotine  21 mg Transdermal Daily  . pantoprazole  40 mg Oral Daily  . polyethylene glycol  17 g Oral Daily  . potassium chloride  40 mEq Oral BID  . sertraline  50 mg Oral Daily  . thiamine  100 mg Oral Daily   Or  . thiamine  100 mg Intravenous Daily  . traZODone  50 mg Oral QHS     Objective: Vital signs in last 24 hours: Temp:  [97.2 F (36.2 C)-98.3 F (36.8 C)] 98.3 F (36.8 C) (03/16 1140) Resp:  [14-21] 16 (03/16 1400) BP: (113-150)/(68-101) 134/89 (03/16 1400) SpO2:  [87 %-100 %] 91 % (03/16 1400)   General appearance: alert, cooperative and no distress Resp: rhonchi bilaterally Cardio: regularly irregular rhythm GI: normal findings: bowel sounds normal and soft, non-tender Extremities: edema none  Lab Results Recent Labs    12/28/18 0332 12/29/18 0418  WBC 14.6* 13.5*  HGB 14.8 14.3  HCT 45.9 42.1  NA 134* 130*  K 2.8* 2.9*  CL 89* 87*  CO2 33* 28  BUN 13 16  CREATININE 0.68 0.60*   Liver Panel Recent Labs    12/28/18 0332 12/29/18 0418  PROT 7.1 6.8  ALBUMIN 3.3* 3.1*  AST 24 19  ALT 18 16  ALKPHOS 52 51  BILITOT 0.8 1.2   Sedimentation Rate No results for input(s): ESRSEDRATE in the last 72 hours. C-Reactive Protein Recent Labs    12/27/18 1422  CRP 6.6*    Microbiology: Recent Results (from the past 240  hour(s))  Blood Culture (routine x 2)     Status: None   Collection Time: 12/22/18 12:45 PM  Result Value Ref Range Status   Specimen Description   Final    BLOOD RIGHT ANTECUBITAL Performed at [facility], [address] [place], [place] [address]    Special Requests   Final    BOTTLES DRAWN AEROBIC AND ANAEROBIC Blood Culture adequate volume Performed at [facility], [address] [place], [place] [address]    Culture   Final    NO GROWTH 5 DAYS Performed at [facility], [address], [place], [place] [address]    Report Status 12/27/2018 FINAL  Final  Blood Culture (routine x 2)     Status: None   Collection Time: 12/22/18  1:10 PM  Result Value Ref Range Status   Specimen Description   Final    BLOOD LEFT FOREARM Performed at [facility], [address] [place], [place] [address]    Special Requests   Final    BOTTLES DRAWN AEROBIC AND ANAEROBIC Blood Culture  adequate volume Performed at [facility], [address] [place], [place] [address]    Culture   Final    NO GROWTH 5 DAYS Performed at [facility], [address] [place], [place] [address]    Report Status 12/27/2018 FINAL  Final  Respiratory Panel by PCR     Status: None   Collection Time: 12/22/18  3:41 PM  Result Value Ref Range Status   Adenovirus NOT DETECTED NOT DETECTED Final   Coronavirus 229E NOT DETECTED NOT DETECTED Final    Comment: (NOTE) The Coronavirus on the Respiratory Panel, DOES NOT test for the novel  Coronavirus (2019 nCoV)    Coronavirus HKU1 NOT DETECTED NOT DETECTED Final   Coronavirus NL63 NOT DETECTED NOT DETECTED Final   Coronavirus OC43 NOT DETECTED NOT DETECTED Final   Metapneumovirus NOT DETECTED NOT DETECTED Final   Rhinovirus / Enterovirus NOT DETECTED NOT DETECTED Final   Influenza A NOT DETECTED NOT DETECTED Final   Influenza B NOT DETECTED NOT DETECTED Final   Parainfluenza Virus 1 NOT DETECTED NOT DETECTED Final   Parainfluenza Virus 2 NOT DETECTED NOT DETECTED Final   Parainfluenza Virus 3 NOT DETECTED NOT DETECTED Final   Parainfluenza Virus 4 NOT DETECTED NOT DETECTED Final   Respiratory Syncytial Virus NOT DETECTED NOT DETECTED Final   Bordetella pertussis NOT DETECTED NOT DETECTED Final   Chlamydophila pneumoniae NOT DETECTED NOT DETECTED Final   Mycoplasma pneumoniae NOT DETECTED NOT DETECTED Final    Comment: Performed at [facility], [address], [place], [place] [address]  MRSA PCR Screening     Status: None   Collection Time: 12/27/18 11:05 AM  Result Value Ref Range Status   MRSA by PCR NEGATIVE NEGATIVE Final    Comment:        The GeneXpert MRSA Assay (FDA approved for NASAL specimens only), is one component of a comprehensive MRSA colonization surveillance program. It is not intended to diagnose MRSA infection nor to guide or monitor treatment for MRSA infections. Performed at [facility], [address] [place], [place] [address]   Culture, blood (routine x 2) Call MD if unable to obtain prior to antibiotics being given     Status: None (Preliminary result)   Collection Time: 12/27/18 11:40 AM  Result Value Ref Range Status  Specimen Description   Final    BLOOD LEFT ARM Performed at [facility], [address] [place], [place] [address]    Special Requests   Final    BOTTLES DRAWN AEROBIC ONLY Blood Culture adequate volume Performed at [facility], [address] [place], [place] [address]    Culture   Final    NO GROWTH 2 DAYS Performed at [facility], [address], [place], [place] [address]    Report Status PENDING  Incomplete  Culture, blood (routine x 2) Call MD if unable to obtain prior to antibiotics being given     Status: None (Preliminary result)   Collection Time: 12/27/18 11:40 AM  Result Value Ref Range Status   Specimen Description   Final    BLOOD RIGHT ARM Performed at [facility], [address], [place], [place] [address]    Special Requests   Final    BOTTLES DRAWN AEROBIC ONLY Blood Culture results may not be optimal due to an inadequate volume of blood received in culture bottles Performed at [facility], [address], [place], [place] [address]    Culture   Final    NO GROWTH 2 DAYS Performed at [facility], [address], [place], [place] [address]    Report Status PENDING  Incomplete    Studies/Results: Dg Chest Port 1 View  Result Date: 12/29/2018 CLINICAL DATA:  Pulmonary infiltrates. EXAM: PORTABLE CHEST 1 VIEW COMPARISON:  12/27/2018 FINDINGS: The cardiomediastinal silhouette is unchanged with normal heart size. The lungs remain hyperinflated. Interstitial and airspace opacities in the mid and lower lungs bilaterally have slightly improved, most notably in the mid lungs. No sizable pleural effusion or pneumothorax is identified. No acute  osseous abnormality is seen. IMPRESSION: Slightly improved bilateral lung opacities compatible with pneumonia. Electronically Signed   By: Sebastian Ache M.D.   On: 12/29/2018 10:18     Assessment/Plan: COPD LLL infiltrate Rib fracture 6th, post fall  Total days of antibiotics: 8 ceftriaxone/azithro  Await COVID testing although this seems unlikely.  Spoke with [facility]- test will be resulted on 3-19  [phone] (acct [phone]) WBC improved Still requiring 10L high flow mask.  No change in azithro/ceftriaxone for now. Likely stop azithro soon.          Johny Sax MD, FACP Infectious Diseases (pager) [phone] [email or web address] 12/29/2018, 3:15 PM  LOS: 7 days

## 2018-12-29 NOTE — Progress Notes (Signed)
NAME:  Eugene Christensen, MRN:  [id number], DOB:  [birth year], LOS: 7 ADMISSION DATE:  12/22/2018, CONSULTATION DATE:  12/22/2018 REFERRING MD:  Dr Rhona Leavens, CHIEF COMPLAINT:  Worsening resp failure iwht infiltrates since admission 5d earlier    History of present illness   52 year from [address] [facility] [place] [address] -  with copd, bp but indepdent ADL. On 12/19/2018 Friday tripped and  fell down going up stairs and felt pain on left side of chest. Says prior to that was feeling baseline. After that only intense left sided pain that presented through ER visit 12/22/2018. durring this he had no myalgia, fatigue or flu like symptoms. But due to persistent pain and related dyspnea he decided to go to ER. Just prior to ER arrival he felt brief chills.  In ER3/06/2019 (no CBC data at admit avialable)  was afebrile at 72F. D-dimer was high 2.41 but PE reuled out. Ruled out standard RVP and flu panel and urine streo. CTA shows patch LLL infiltrates (also showed -> Ununited fracture of the posterolateral aspect right sixth rib. Old healed right eleventh rib fracture - CONTRALATERAL TO PAIN from Fall.) . He was hypoxemic needing 6LNC at arrival but by time of admit weaned down to 2L [place] and was wheesy.  COVIDROME profile  - history:  initial symptoms are not c.w COVID - travel: none - sick cluster exposure: none (works at [employer], last worked 2d prior to fall and does not remember travelers from beyond [place]). No sick family cluster - low WBC at admit - data not available, currently high on steroids  -PCT:  < 0.1 and can be c/w Covid - Alternative diagnosis: None available and so raises question of covid - Course: Worsening infiltrates and can be c/w Covid  Past Medical History  HTN Significant Hospital Events   12/22/2018 - admit 3/14 - SDU transfer for worsening hypoxemia 3/10 - Hospitalist noted: Breathing more easily than at admission but still short of breath even a little at rest which is not his baseline.  Does not experience shortness of breath at baseline even with moderate walking. Required 4L O2 to maintain SpO2 89% when ambulating with PT today. No new chest pain, left-sided rib pain is stable.  3/14 - RN noted: Pt sitting up in bed with non-rebreather on and O2 saturation 88%. Alert and oriented x4. No accessory muscle use and able to speak in full sentences without having to catch breath. O2 saturations up to 96%. Non-rebreather removed and HFNC reapplied at 15L. O2 sats remained 94%. Saturations did drop with exertion to 88% but able to recover back up to 94%. Paient transfer to SDU and ccm consul]ed. CXR with worsening bilateral infiltrates 3/15: He feels better.  Infiltrates improved, still on high flow oxygen, and nasal cannula, nasal cannula moved to 10 L down from 15 Consults:  3/14 - CCM  Procedures:    Significant Diagnostic Tests:    Micro Data:  3/9 - flu pcr neg 3/9 - rvp - neg 3/9 urine strep -neg 3/9 - urine tox -opioids osive  Antimicrobials:      Interim history/subjective:  Feeling better  Objective   Blood pressure 138/77, pulse (Abnormal) 105, temperature 98 F (36.7 C), temperature source Axillary, resp. rate 16, height 5\' 11"  (1.803 m), weight 62.4 kg, SpO2 91 %.        Intake/Output Summary (Last 24 hours) at 12/29/2018 0823 Last data filed at 12/29/2018 0700 Gross per 24 hour  Intake  834.1 ml  Output 2560 ml  Net -1725.9 ml   Filed Weights   12/22/18 1600 12/27/18 1040  Weight: 68 kg 62.4 kg    Examination:  General: This otherwise healthy appearing 53 year old male he is resting in bed and in no acute distress HEENT normocephalic atraumatic no jugular venous distention mucous membranes are moist dentition intact Pulmonary: Scattered rhonchi diminished bases some basilar rales Cardiac: Regular rate and rhythm without murmur rub or gallop Abdomen: Soft not tender no organomegaly Extremities: Warm dry brisk cap refill GU: Clear yellow    Resolved Hospital Problem list     Assessment & Plan:  Acute Hypoxemic Resp failure with worsening infiltrates  - Low Prob for COVID. He has worsening infilrates over 7 days and hypoxemia Pct nml.  -crp: elevated, LDH elevated, ferritin elevated (all seen w/ COVID)  This makes it concering for COVID19 but initial history and exposure/travel is not consistent. This could also be ALI from fall and/or acute CHF Nos PCXR (most recent: bilateral infiltrates); compared to chest x-ray now which I personally reviewed this demonstrates fairly significant improvement on the left more than right -neg 1.1 liters  Plan F/u COVID testing & cont droplet and contact iso F/u echo Cont lasix as long as BUN/cr and BP allow pcxr now (f/u infiltrates) Antibiotics per infectious disease  Best practice:  Diet: per triad Pain/Anxiety/Delirium protocol (if indicated): per triad VAP protocol (if indicated): per tiad DVT prophylaxis: per triad GI prophylaxis: per triad Glucose control: per triad Mobility: bed rest Code Status: full Family Communication: patient Disposition: cont droplet isolation and contact isolation. (N95 mask for procedures or aerolized procedures)  Simonne Martinet ACNP-BC [facility] Pulmonary/Critical Care Pager # [phone] OR # [phone] if no answer    12/29/2018 8:23 AM

## 2018-12-29 NOTE — Progress Notes (Signed)
  Echocardiogram 2D Echocardiogram has been performed.  Eugene Christensen 12/29/2018, 2:27 PM

## 2018-12-29 NOTE — Progress Notes (Signed)
Patient was seen with Zenia Resides this morning  53 year old gentleman, presented with chest pain following a fall  Developed patchy lung infiltrates with concern for Covid-19--test results pending  COVIDROME profile  - history:  initial symptoms are not c.w COVID - travel: none - sick cluster exposure: none (works at [employer], last worked 2d prior to fall and does not remember travelers from beyond [place]). No sick family cluster - low WBC at admit - data not available, currently high on steroids  -PCT:  < 0.1 and can be c/w Covid - Alternative diagnosis: None available and so raises question of covid - Course: Worsening infiltrates and can be c/w Covid  Feeling better still with significant oxygen requirement  Vitals:   12/29/18 1140 12/29/18 1200  BP:  (!) 128/101  Pulse:    Resp:  (!) 21  Temp: 98.3 F (36.8 C)   SpO2:  (!) 87%   Healthy-appearing 53 year old gentleman Normocephalic atraumatic Scattered rhonchi bilaterally Bowel sounds normoactive   Chest x-ray reviewed showing multifocal infiltrates  Acute hypoxemic respiratory failure with worsening lung infiltrates Maintain isolation Follow-up call with testing results Maintain droplet and contact isolation Antibiotics per infectious disease  Discussed with Dr. Rhona Leavens this morning  Maintain isolation, follow testing  Virl Diamond, MD

## 2018-12-30 LAB — BASIC METABOLIC PANEL
Anion gap: 9 (ref 5–15)
BUN: 13 mg/dL (ref 6–20)
CO2: 32 mmol/L (ref 22–32)
Calcium: 8.4 mg/dL — ABNORMAL LOW (ref 8.9–10.3)
Chloride: 91 mmol/L — ABNORMAL LOW (ref 98–111)
Creatinine, Ser: 0.65 mg/dL (ref 0.61–1.24)
GFR calc Af Amer: >60 mL/min (ref >60)
GFR calc non Af Amer: >60 mL/min (ref >60)
Glucose, Bld: 112 mg/dL — ABNORMAL HIGH (ref 70–99)
Potassium: 3.8 mmol/L (ref 3.5–5.1)
Sodium: 132 mmol/L — ABNORMAL LOW (ref 135–145)

## 2018-12-30 LAB — CBC
HCT: 40.6 % (ref 39.0–52.0)
Hemoglobin: 13.8 g/dL (ref 13.0–17.0)
MCH: 32.9 pg (ref 26.0–34.0)
MCHC: 34 g/dL (ref 30.0–36.0)
MCV: 96.9 fL (ref 80.0–100.0)
Platelets: 322 10*3/uL (ref 150–400)
RBC: 4.19 MIL/uL — AB (ref 4.22–5.81)
RDW: 12.4 % (ref 11.5–15.5)
WBC: 12.6 10*3/uL — ABNORMAL HIGH (ref 4.0–10.5)
nRBC: 0 % (ref 0.0–0.2)

## 2018-12-30 MED ORDER — IPRATROPIUM-ALBUTEROL 0.5-2.5 (3) MG/3ML IN SOLN: 3.0000 mL | RESPIRATORY_TRACT | Status: DC | PRN

## 2018-12-30 MED ORDER — ALBUTEROL SULFATE HFA 108 (90 BASE) MCG/ACT IN AERS
2.0000 | INHALATION_SPRAY | RESPIRATORY_TRACT | Status: DC | PRN
  Filled 2018-12-30 (×2): qty 6.7

## 2018-12-30 MED ORDER — UMECLIDINIUM BROMIDE 62.5 MCG/INH IN AEPB
1.0000 | INHALATION_SPRAY | Freq: Every day | RESPIRATORY_TRACT | Status: DC
  Administered 2018-12-30 – 2019-01-03 (×5): 1 via RESPIRATORY_TRACT
  Filled 2018-12-30: qty 7

## 2018-12-30 NOTE — Progress Notes (Signed)
NAME:  Eugene Christensen, MRN:  [id number], DOB:  03-Sep-1966, LOS: 8 ADMISSION DATE:  12/22/2018, CONSULTATION DATE:  12/22/2018 REFERRING MD:  Dr Rhona Leavens, CHIEF COMPLAINT:  Worsening resp failure iwht infiltrates since admission 5d earlier    History of present illness   52 year from [address] [facility] [place] [address] -  with copd, bp but indepdent ADL. On 12/19/2018 Friday tripped and  fell down going up stairs and felt pain on left side of chest. Says prior to that was feeling baseline. After that only intense left sided pain that presented through ER visit 12/22/2018. durring this he had no myalgia, fatigue or flu like symptoms. But due to persistent pain and related dyspnea he decided to go to ER. Just prior to ER arrival he felt brief chills.  In ER3/06/2019 (no CBC data at admit avialable)  was afebrile at 71F. D-dimer was high 2.41 but PE reuled out. Ruled out standard RVP and flu panel and urine streo. CTA shows patch LLL infiltrates (also showed -> Ununited fracture of the posterolateral aspect right sixth rib. Old healed right eleventh rib fracture - CONTRALATERAL TO PAIN from Fall.) . He was hypoxemic needing 6LNC at arrival but by time of admit weaned down to 2L [place] and was wheesy.  COVIDROME profile  - history:  initial symptoms are not c.w COVID - travel: none - sick cluster exposure: none (works at [employer], last worked 2d prior to fall and does not remember travelers from beyond [place]). No sick family cluster - low WBC at admit - data not available, currently high on steroids  -PCT:  < 0.1 and can be c/w Covid - Alternative diagnosis: None available and so raises question of covid - Course: Worsening infiltrates and can be c/w Covid  Past Medical History  HTN Significant Hospital Events   12/22/2018 - admit 3/14 - SDU transfer for worsening hypoxemia 3/10 - Hospitalist noted: Breathing more easily than at admission but still short of breath even a little at rest which is not his baseline.  Does not experience shortness of breath at baseline even with moderate walking. Required 4L O2 to maintain SpO2 89% when ambulating with PT today. No new chest pain, left-sided rib pain is stable.  3/14 - RN noted: Pt sitting up in bed with non-rebreather on and O2 saturation 88%. Alert and oriented x4. No accessory muscle use and able to speak in full sentences without having to catch breath. O2 saturations up to 96%. Non-rebreather removed and HFNC reapplied at 15L. O2 sats remained 94%. Saturations did drop with exertion to 88% but able to recover back up to 94%. Paient transfer to SDU and ccm consul]ed. CXR with worsening bilateral infiltrates 3/15: He feels better.  Infiltrates improved, still on high flow oxygen, and nasal cannula, nasal cannula moved to 10 L down from 15 Consults:  3/14 - CCM  Procedures:    Significant Diagnostic Tests:    Micro Data:  3/9 - flu pcr neg 3/9 - rvp - neg 3/9 urine strep -neg 3/9 - urine tox -opioids osive  Antimicrobials:      Interim history/subjective:  Feeling better  Objective   Blood pressure 128/79, pulse (!) 105, temperature (!) 96.8 F (36 C), temperature source Axillary, resp. rate 11, height 5\' 11"  (1.803 m), weight 62.4 kg, SpO2 94 %.        Intake/Output Summary (Last 24 hours) at 12/30/2018 0757 Last data filed at 12/30/2018 0527 Gross per 24 hour  Intake 48.83 ml  Output 2425 ml  Net -2376.17 ml   Filed Weights   12/22/18 1600 12/27/18 1040  Weight: 68 kg 62.4 kg    Examination:  General: Thin male use no acute distress at rest HEENT: No JVD or lymphadenopathy is appreciated Neuro:  CV: Heart sounds are regular regular rate and rhythm PULM: Coarse rhonchi left greater than right with some expiratory wheezes WU:[id number], non-tender, bsx4 active  Extremities: warm/dry, negative edema  Skin: no rashes or lesions    Intake/Output Summary (Last 24 hours) at 12/30/2018 0801 Last data filed at 12/30/2018 9147 Gross per  24 hour  Intake 48.83 ml  Output 1975 ml  Net -1926.17 ml    Resolved Hospital Problem list     Assessment & Plan:  Acute Hypoxemic Resp failure with worsening infiltrates  - Low Prob for COVID. He has worsening infilrates over 8 days and hypoxemia Pct nml.  -crp: elevated, LDH elevated, ferritin elevated (all seen w/ COVID)  This makes it concering for COVID19 but initial history and exposure/travel is not consistent. This could also be ALI from fall and/or acute CHF Nos PCXR (most recent: bilateral infiltrates); compared to chest x-ray now which I personally reviewed this demonstrates fairly significant improvement on the left more than right -1.9 L Plan Continue monitoring coronavirus, testing results pending Continue respiratory isolation Follow-up echo on 12/29/2018 essentially unremarkable Diuresis as long as BUN/creatinine blood pressure will allow Serial chest x-ray Antimicrobial therapy per infectious disease Pulmonary critical care will be available as needed  Best practice:  Diet: per triad Pain/Anxiety/Delirium protocol (if indicated): per triad VAP protocol (if indicated): per tiad DVT prophylaxis: per triad GI prophylaxis: per triad Glucose control: per triad Mobility: bed rest Code Status: full Family Communication: patient updated at bedside. Disposition: cont droplet isolation and contact isolation. (N95 mask for procedures or aerolized procedures)  Brett Canales Minor ACNP [facility] P[facility] Pager [phone] till 1 pm If no answer page [phone] 12/30/2018, 7:58 AM

## 2018-12-30 NOTE — Progress Notes (Signed)
INFECTIOUS DISEASE PROGRESS NOTE  ID: Eugene Christensen is a 53 y.o. male with  Principal Problem:   Community acquired pneumonia of left lower lobe of lung (HCC) Active Problems:   Multiple rib fractures   COPD with acute exacerbation (HCC)   Essential hypertension  Subjective: No complaints Still some pain with deep breathing.   Abtx:  Anti-infectives (From admission, onward)   Start     Dose/Rate Route Frequency Ordered Stop   12/22/18 1345  cefTRIAXone (ROCEPHIN) 1 g in sodium chloride 0.9 % 100 mL IVPB  Status:  Discontinued     1 g 200 mL/hr over 30 Minutes Intravenous Every 24 hours 12/22/18 1331 12/22/18 1332   12/22/18 1345  azithromycin (ZITHROMAX) 500 mg in sodium chloride 0.9 % 250 mL IVPB  Status:  Discontinued     500 mg 250 mL/hr over 60 Minutes Intravenous Every 24 hours 12/22/18 1331 12/22/18 1332   12/22/18 1245  cefTRIAXone (ROCEPHIN) 2 g in sodium chloride 0.9 % 100 mL IVPB     2 g 200 mL/hr over 30 Minutes Intravenous Every 24 hours 12/22/18 1230     12/22/18 1245  azithromycin (ZITHROMAX) 500 mg in sodium chloride 0.9 % 250 mL IVPB     500 mg 250 mL/hr over 60 Minutes Intravenous Every 24 hours 12/22/18 1230     12/22/18 1245  azithromycin (ZITHROMAX) 500 MG injection    Note to Pharmacy:  Gertie Baron   : cabinet override      12/22/18 1245 12/23/18 0059      Medications:  Scheduled: . alfuzosin  10 mg Oral Daily  . calcium carbonate  1 tablet Oral Q breakfast  . Chlorhexidine Gluconate Cloth  6 each Topical Daily  . finasteride  5 mg Oral Daily  . folic acid  1 mg Oral Daily  . furosemide  40 mg Intravenous Q12H  . guaiFENesin  600 mg Oral BID  . mouth rinse  15 mL Mouth Rinse BID  . multivitamin with minerals  1 tablet Oral Daily  . nicotine  21 mg Transdermal Daily  . pantoprazole  40 mg Oral Daily  . polyethylene glycol  17 g Oral Daily  . sertraline  50 mg Oral Daily  . thiamine  100 mg Oral Daily   Or  . thiamine  100 mg Intravenous  Daily  . traZODone  50 mg Oral QHS  . umeclidinium bromide  1 puff Inhalation Daily    Objective: Vital signs in last 24 hours: Temp:  [96.8 F (36 C)-98.3 F (36.8 C)] 97.9 F (36.6 C) (03/17 0800) Resp:  [10-26] 19 (03/17 1000) BP: (100-145)/(61-101) 109/82 (03/17 1000) SpO2:  [80 %-100 %] 95 % (03/17 1000)   General appearance: alert, cooperative and no distress Resp: diminished breath sounds bilaterally Cardio: regular rate and rhythm GI: normal findings: bowel sounds normal and soft, non-tender Extremities: edema none  Lab Results Recent Labs    12/29/18 0418 12/30/18 0510  WBC 13.5* 12.6*  HGB 14.3 13.8  HCT 42.1 40.6  NA 130* 132*  K 2.9* 3.8  CL 87* 91*  CO2 28 32  BUN 16 13  CREATININE 0.60* 0.65   Liver Panel Recent Labs    12/28/18 0332 12/29/18 0418  PROT 7.1 6.8  ALBUMIN 3.3* 3.1*  AST 24 19  ALT 18 16  ALKPHOS 52 51  BILITOT 0.8 1.2   Sedimentation Rate No results for input(s): ESRSEDRATE in the last 72 hours. C-Reactive Protein Recent Labs  12/27/18 1422  CRP 6.6*    Microbiology: Recent Results (from the past 240 hour(s))  Blood Culture (routine x 2)     Status: None   Collection Time: 12/22/18 12:45 PM  Result Value Ref Range Status   Specimen Description   Final    BLOOD RIGHT ANTECUBITAL Performed at [facility], [address] [place], [place] [address]    Special Requests   Final    BOTTLES DRAWN AEROBIC AND ANAEROBIC Blood Culture adequate volume Performed at [facility], [address] [place], [place] [address]    Culture   Final    NO GROWTH 5 DAYS Performed at [facility], [address] [place], [place] [address]    Report Status 12/27/2018 FINAL  Final  Blood Culture (routine x 2)     Status: None   Collection Time: 12/22/18  1:10 PM  Result Value Ref Range Status   Specimen Description   Final    BLOOD LEFT FOREARM Performed at [facility], [address],  [place], [place] [address]    Special Requests   Final    BOTTLES DRAWN AEROBIC AND ANAEROBIC Blood Culture adequate volume Performed at [facility], [address] [place], [place] [address]    Culture   Final    NO GROWTH 5 DAYS Performed at [facility], [address] [place], [place] [address]    Report Status 12/27/2018 FINAL  Final  Respiratory Panel by PCR     Status: None   Collection Time: 12/22/18  3:41 PM  Result Value Ref Range Status   Adenovirus NOT DETECTED NOT DETECTED Final   Coronavirus 229E NOT DETECTED NOT DETECTED Final    Comment: (NOTE) The Coronavirus on the Respiratory Panel, DOES NOT test for the novel  Coronavirus (2019 nCoV)    Coronavirus HKU1 NOT DETECTED NOT DETECTED Final   Coronavirus NL63 NOT DETECTED NOT DETECTED Final   Coronavirus OC43 NOT DETECTED NOT DETECTED Final   Metapneumovirus NOT DETECTED NOT DETECTED Final   Rhinovirus / Enterovirus NOT DETECTED NOT DETECTED Final   Influenza A NOT DETECTED NOT DETECTED Final   Influenza B NOT DETECTED NOT DETECTED Final   Parainfluenza Virus 1 NOT DETECTED NOT DETECTED Final   Parainfluenza Virus 2 NOT DETECTED NOT DETECTED Final   Parainfluenza Virus 3 NOT DETECTED NOT DETECTED Final   Parainfluenza Virus 4 NOT DETECTED NOT DETECTED Final   Respiratory Syncytial Virus NOT DETECTED NOT DETECTED Final   Bordetella pertussis NOT DETECTED NOT DETECTED Final   Chlamydophila pneumoniae NOT DETECTED NOT DETECTED Final   Mycoplasma pneumoniae NOT DETECTED NOT DETECTED Final    Comment: Performed at [facility], [address] [place], [place] [address]  MRSA PCR Screening     Status: None   Collection Time: 12/27/18 11:05 AM  Result Value Ref Range Status   MRSA by PCR NEGATIVE NEGATIVE Final    Comment:        The GeneXpert MRSA Assay (FDA approved for NASAL specimens only), is one component of a comprehensive MRSA colonization surveillance program. It is not intended to  diagnose MRSA infection nor to guide or monitor treatment for MRSA infections. Performed at [facility], [address], [place], [place] [address]   Culture, blood (routine x 2) Call MD if unable to obtain prior to antibiotics being given     Status: None (Preliminary  result)   Collection Time: 12/27/18 11:40 AM  Result Value Ref Range Status   Specimen Description   Final    BLOOD LEFT ARM Performed at [facility], [address] [place], [place] [address]    Special Requests   Final    BOTTLES DRAWN AEROBIC ONLY Blood Culture adequate volume Performed at [facility], [address] [place], [place] [address]    Culture   Final    NO GROWTH 3 DAYS Performed at [facility], [address] [place], [place] [address]    Report Status PENDING  Incomplete  Culture, blood (routine x 2) Call MD if unable to obtain prior to antibiotics being given     Status: None (Preliminary result)   Collection Time: 12/27/18 11:40 AM  Result Value Ref Range Status   Specimen Description   Final    BLOOD RIGHT ARM Performed at [facility], [address], [place], [place] [address]    Special Requests   Final    BOTTLES DRAWN AEROBIC ONLY Blood Culture results may not be optimal due to an inadequate volume of blood received in culture bottles Performed at [facility], [address] [place], [place] [address]    Culture   Final    NO GROWTH 3 DAYS Performed at [facility], [address] [place], [place] [address]    Report Status PENDING  Incomplete    Studies/Results: Dg Chest Port 1 View  Result Date: 12/29/2018 CLINICAL DATA:  Pulmonary infiltrates. EXAM: PORTABLE CHEST 1 VIEW COMPARISON:  12/27/2018 FINDINGS: The cardiomediastinal silhouette is unchanged with normal heart size. The lungs remain hyperinflated. Interstitial and airspace opacities in the mid and lower lungs bilaterally have slightly  improved, most notably in the mid lungs. No sizable pleural effusion or pneumothorax is identified. No acute osseous abnormality is seen. IMPRESSION: Slightly improved bilateral lung opacities compatible with pneumonia. Electronically Signed   By: Sebastian Ache M.D.   On: 12/29/2018 10:18     Assessment/Plan: COPD LLL infiltrate Rib fracture 6th, post fall  Total days of antibiotics: 9 ceftriaxone/azithro       Await COVID testing although this seems unlikely.  Spoke with [facility]- test will be resulted on 3-19  [phone] (acct [phone]) Will stop azithro Will stop corona isolation.  Making small, gradual improvement.   Johny Sax MD, FACP Infectious Diseases (pager) [phone] [email or web address] 12/30/2018, 10:52 AM  LOS: 8 days

## 2018-12-30 NOTE — Progress Notes (Signed)
PROGRESS NOTE    Eugene Christensen  WOE:[id number] DOB: 1966-02-28 DOA: 12/22/2018 PCP: [facility]    Brief Narrative:  53 y.o. male with a history of tobacco use, alcohol use, and HTN who presented to the ED with increasing dyspnea that started a few days prior, shortly after he tripped going up stairs at his parents' house, falling backwards with resultant significant left-sided chest pain with breathing. He was found to have increased work of breathing, wheezing, and continued to have hypoxia despite continuous nebs. CXR revealed right 6th rib fracture without significant PTX or infiltrate, subsequent CTA chest was performed showing developing LLL pneumonia. Due to ongoing hypoxia, admission was requested.   Assessment & Plan:   Principal Problem:   Community acquired pneumonia of left lower lobe of lung (HCC) Active Problems:   Multiple rib fractures   COPD with acute exacerbation (HCC)   Essential hypertension  Acute hypoxic respiratory failure with ARDS -Progressively worsening -Initially suspected from rib fx with PNA. Now in ARDS -Flu and RVP negative.  -No recent exposures initially suggestive of nCoV. However, resp viral panel neg, MRSA swab neg. No other identifiable source at this time - Given IV lasix with good diuresis however patient remains markedly hypoxemic -Presently remains on NRB with high flow O2 -This AM, pt speaking in full sentences, no accessory muscle use, alert and oriented - Appreciate assistance by PCCM -ID consulted and following. Recommendation for formal COVID-19 testing - Continue airborne isolation per protocol. -Per ID, COVID-19 result anticipated around 3/19. Unlikely chance of actual COVID. O2 requirements seem to be slowly improving -Recent repeat CXR personally reviewed. Appears to be improved  Tobacco use:  - Plans to try not to smoke anymore.  -Cessation counseling provided this admit -Presently stable  History of alcohol use:   - No evidence of withdrawal at this time. - currently stable  BPH:  - Continue alfuzosin, finasteride - Currently stable  GERD:  - Continue PPI - Stable currently  Depression:  - Continue SSRI, trazodone - Presently stable  Acute R 6th rib fracture -Noted on 3/9 imaging -no pneumothorax, edema or consolidation. Reviewed -Currently stable.  DVT prophylaxis: Lovenox subQ Code Status: Full Family Communication: Pt in room, family not at bedside Disposition Plan: Uncertain at this time  Consultants:   Critical Care  Discussed case with ID  Procedures:     Antimicrobials: Anti-infectives (From admission, onward)   Start     Dose/Rate Route Frequency Ordered Stop   12/22/18 1345  cefTRIAXone (ROCEPHIN) 1 g in sodium chloride 0.9 % 100 mL IVPB  Status:  Discontinued     1 g 200 mL/hr over 30 Minutes Intravenous Every 24 hours 12/22/18 1331 12/22/18 1332   12/22/18 1345  azithromycin (ZITHROMAX) 500 mg in sodium chloride 0.9 % 250 mL IVPB  Status:  Discontinued     500 mg 250 mL/hr over 60 Minutes Intravenous Every 24 hours 12/22/18 1331 12/22/18 1332   12/22/18 1245  cefTRIAXone (ROCEPHIN) 2 g in sodium chloride 0.9 % 100 mL IVPB     2 g 200 mL/hr over 30 Minutes Intravenous Every 24 hours 12/22/18 1230     12/22/18 1245  azithromycin (ZITHROMAX) 500 mg in sodium chloride 0.9 % 250 mL IVPB  Status:  Discontinued     500 mg 250 mL/hr over 60 Minutes Intravenous Every 24 hours 12/22/18 1230 12/30/18 1059   12/22/18 1245  azithromycin (ZITHROMAX) 500 MG injection    Note to Pharmacy:  Gertie Baron   :  cabinet override      12/22/18 1245 12/23/18 0059      Subjective: Needing slightly less O2 requirement  Objective: Vitals:   12/30/18 1000 12/30/18 1100 12/30/18 1128 12/30/18 1200  BP: 109/82 123/81  139/85  Pulse:      Resp: 19 19  18   Temp:    97.6 F (36.4 C)  TempSrc:    Axillary  SpO2: 95% 91% 90% 92%  Weight:      Height:        Intake/Output  Summary (Last 24 hours) at 12/30/2018 1227 Last data filed at 12/30/2018 0527 Gross per 24 hour  Intake -  Output 1975 ml  Net -1975 ml   Filed Weights   12/22/18 1600 12/27/18 1040  Weight: 68 kg 62.4 kg    Examination: General exam: appears in no acute distress Respiratory system: normal chest rise Cardiovascular system: perfused Data Reviewed: I have personally reviewed following labs and imaging studies  CBC: Recent Labs  Lab 12/26/18 0625 12/27/18 0553 12/28/18 0332 12/29/18 0418 12/30/18 0510  WBC 20.9* 19.0* 14.6* 13.5* 12.6*  NEUTROABS  --   --  9.6*  --   --   HGB 13.9 13.7 14.8 14.3 13.8  HCT 43.0 40.9 45.9 42.1 40.6  MCV 100.7* 98.8 99.4 95.9 96.9  PLT 229 251 265 294 322   Basic Metabolic Panel: Recent Labs  Lab 12/26/18 0625 12/27/18 0553 12/28/18 0332 12/29/18 0418 12/30/18 0510  NA 134* 137 134* 130* 132*  K 4.3 3.4* 2.8* 2.9* 3.8  CL 95* 95* 89* 87* 91*  CO2 31 29 33* 28 32  GLUCOSE 129* 121* 90 91 112*  BUN 10 10 13 16 13   CREATININE 0.68 0.57* 0.68 0.60* 0.65  CALCIUM 8.2* 8.4* 8.6* 8.6* 8.4*  MG  --   --   --  1.8  --    GFR: Estimated Creatinine Clearance: 94.3 mL/min (by C-G formula based on SCr of 0.65 mg/dL). Liver Function Tests: Recent Labs  Lab 12/26/18 0625 12/28/18 0332 12/29/18 0418  AST 17 24 19   ALT 12 18 16   ALKPHOS 47 52 51  BILITOT 0.7 0.8 1.2  PROT 6.2* 7.1 6.8  ALBUMIN 3.0* 3.3* 3.1*   No results for input(s): LIPASE, AMYLASE in the last 168 hours. No results for input(s): AMMONIA in the last 168 hours. Coagulation Profile: No results for input(s): INR, PROTIME in the last 168 hours. Cardiac Enzymes: No results for input(s): CKTOTAL, CKMB, CKMBINDEX, TROPONINI in the last 168 hours. BNP (last 3 results) No results for input(s): PROBNP in the last 8760 hours. HbA1C: No results for input(s): HGBA1C in the last 72 hours. CBG: Recent Labs  Lab 12/25/18 0803  GLUCAP 140*   Lipid Profile: No results for  input(s): CHOL, HDL, LDLCALC, TRIG, CHOLHDL, LDLDIRECT in the last 72 hours. Thyroid Function Tests: No results for input(s): TSH, T4TOTAL, FREET4, T3FREE, THYROIDAB in the last 72 hours. Anemia Panel: Recent Labs    12/27/18 1422  FERRITIN 349*   Sepsis Labs: Recent Labs  Lab 12/27/18 1201  PROCALCITON <0.10    Recent Results (from the past 240 hour(s))  Blood Culture (routine x 2)     Status: None   Collection Time: 12/22/18 12:45 PM  Result Value Ref Range Status   Specimen Description   Final    BLOOD RIGHT ANTECUBITAL Performed at [facility], [address] [place], [place] [address]    Special Requests   Final  BOTTLES DRAWN AEROBIC AND ANAEROBIC Blood Culture adequate volume Performed at [facility], [address] [place], [place] [address]    Culture   Final    NO GROWTH 5 DAYS Performed at [facility], [address], [place], [place] [address]    Report Status 12/27/2018 FINAL  Final  Blood Culture (routine x 2)     Status: None   Collection Time: 12/22/18  1:10 PM  Result Value Ref Range Status   Specimen Description   Final    BLOOD LEFT FOREARM Performed at [facility], [address], [place], [place] [address]    Special Requests   Final    BOTTLES DRAWN AEROBIC AND ANAEROBIC Blood Culture adequate volume Performed at [facility], [address] [place], [place] [address]    Culture   Final    NO GROWTH 5 DAYS Performed at [facility], [address] [place], [place] [address]    Report Status 12/27/2018 FINAL  Final  Respiratory Panel by PCR     Status: None   Collection Time: 12/22/18  3:41 PM  Result Value Ref Range Status   Adenovirus NOT DETECTED NOT DETECTED Final   Coronavirus 229E NOT DETECTED NOT DETECTED Final    Comment: (NOTE) The Coronavirus on the Respiratory Panel, DOES NOT test for the novel  Coronavirus (2019 nCoV)    Coronavirus HKU1 NOT DETECTED NOT DETECTED  Final   Coronavirus NL63 NOT DETECTED NOT DETECTED Final   Coronavirus OC43 NOT DETECTED NOT DETECTED Final   Metapneumovirus NOT DETECTED NOT DETECTED Final   Rhinovirus / Enterovirus NOT DETECTED NOT DETECTED Final   Influenza A NOT DETECTED NOT DETECTED Final   Influenza B NOT DETECTED NOT DETECTED Final   Parainfluenza Virus 1 NOT DETECTED NOT DETECTED Final   Parainfluenza Virus 2 NOT DETECTED NOT DETECTED Final   Parainfluenza Virus 3 NOT DETECTED NOT DETECTED Final   Parainfluenza Virus 4 NOT DETECTED NOT DETECTED Final   Respiratory Syncytial Virus NOT DETECTED NOT DETECTED Final   Bordetella pertussis NOT DETECTED NOT DETECTED Final   Chlamydophila pneumoniae NOT DETECTED NOT DETECTED Final   Mycoplasma pneumoniae NOT DETECTED NOT DETECTED Final    Comment: Performed at [facility], [address] [place], [place] [address]  MRSA PCR Screening     Status: None   Collection Time: 12/27/18 11:05 AM  Result Value Ref Range Status   MRSA by PCR NEGATIVE NEGATIVE Final    Comment:        The GeneXpert MRSA Assay (FDA approved for NASAL specimens only), is one component of a comprehensive MRSA colonization surveillance program. It is not intended to diagnose MRSA infection nor to guide or monitor treatment for MRSA infections. Performed at [facility], [address] [place], [place] [address]   Culture, blood (routine x 2) Call MD if unable to obtain prior to antibiotics being given     Status: None (Preliminary result)   Collection Time: 12/27/18 11:40 AM  Result Value Ref Range Status   Specimen Description   Final    BLOOD LEFT ARM Performed at [facility], [address] [place], [place] [address]    Special Requests   Final    BOTTLES DRAWN AEROBIC ONLY Blood Culture adequate volume Performed at [facility], [address], [place], [place] [address]    Culture   Final  NO GROWTH 3 DAYS Performed at [facility], [address] [place], [place] [address]    Report Status PENDING  Incomplete  Culture, blood (routine x 2) Call MD if unable to obtain prior to antibiotics being given     Status: None (Preliminary result)   Collection Time: 12/27/18 11:40 AM  Result Value Ref Range Status   Specimen Description   Final    BLOOD RIGHT ARM Performed at [facility], [address] [place], [place] [address]    Special Requests   Final    BOTTLES DRAWN AEROBIC ONLY Blood Culture results may not be optimal due to an inadequate volume of blood received in culture bottles Performed at [facility], [address] [place], [place] [address]    Culture   Final    NO GROWTH 3 DAYS Performed at [facility], [address], [place], [place] [address]    Report Status PENDING  Incomplete     Radiology Studies: Dg Chest Port 1 View  Result Date: 12/29/2018 CLINICAL DATA:  Pulmonary infiltrates. EXAM: PORTABLE CHEST 1 VIEW COMPARISON:  12/27/2018 FINDINGS: The cardiomediastinal silhouette is unchanged with normal heart size. The lungs remain hyperinflated. Interstitial and airspace opacities in the mid and lower lungs bilaterally have slightly improved, most notably in the mid lungs. No sizable pleural effusion or pneumothorax is identified. No acute osseous abnormality is seen. IMPRESSION: Slightly improved bilateral lung opacities compatible with pneumonia. Electronically Signed   By: Sebastian Ache M.D.   On: 12/29/2018 10:18    Scheduled Meds: . alfuzosin  10 mg Oral Daily  . calcium carbonate  1 tablet Oral Q breakfast  . Chlorhexidine Gluconate Cloth  6 each Topical Daily  . finasteride  5 mg Oral Daily  . folic acid  1 mg Oral Daily  . furosemide  40 mg Intravenous Q12H  . guaiFENesin  600 mg Oral BID  . mouth rinse  15 mL Mouth Rinse BID  . multivitamin with minerals  1 tablet Oral Daily  . nicotine  21 mg Transdermal Daily  . pantoprazole  40 mg Oral  Daily  . polyethylene glycol  17 g Oral Daily  . sertraline  50 mg Oral Daily  . thiamine  100 mg Oral Daily   Or  . thiamine  100 mg Intravenous Daily  . traZODone  50 mg Oral QHS  . umeclidinium bromide  1 puff Inhalation Daily   Continuous Infusions: . cefTRIAXone (ROCEPHIN)  IV Stopped (12/29/18 1414)     LOS: 8 days   Rickey Barbara, MD [facility] Pager On Amion  If 7PM-7AM, please contact night-coverage 12/30/2018, 12:27 PM

## 2018-12-31 ENCOUNTER — Inpatient Hospital Stay (HOSPITAL_COMMUNITY): Payer: Medicare Other

## 2018-12-31 DIAGNOSIS — J9601 Acute respiratory failure with hypoxia: Secondary | ICD-10-CM | POA: Diagnosis present

## 2018-12-31 DIAGNOSIS — J80 Acute respiratory distress syndrome: Secondary | ICD-10-CM

## 2018-12-31 DIAGNOSIS — S2242XD Multiple fractures of ribs, left side, subsequent encounter for fracture with routine healing: Secondary | ICD-10-CM

## 2018-12-31 LAB — BASIC METABOLIC PANEL
Anion gap: 11 (ref 5–15)
Anion gap: 13 (ref 5–15)
BUN: 15 mg/dL (ref 6–20)
BUN: 13 mg/dL (ref 6–20)
CALCIUM: 8.8 mg/dL — AB (ref 8.9–10.3)
CO2: 30 mmol/L (ref 22–32)
CO2: 31 mmol/L (ref 22–32)
CREATININE: 0.74 mg/dL (ref 0.61–1.24)
Calcium: 9.2 mg/dL (ref 8.9–10.3)
Chloride: 88 mmol/L — ABNORMAL LOW (ref 98–111)
Chloride: 83 mmol/L — ABNORMAL LOW (ref 98–111)
Creatinine, Ser: 0.63 mg/dL (ref 0.61–1.24)
GFR calc Af Amer: >60 mL/min (ref >60)
GFR calc Af Amer: >60 mL/min (ref >60)
GFR calc non Af Amer: >60 mL/min (ref >60)
GFR calc non Af Amer: >60 mL/min (ref >60)
Glucose, Bld: 105 mg/dL — ABNORMAL HIGH (ref 70–99)
Glucose, Bld: 97 mg/dL (ref 70–99)
Potassium: 3.8 mmol/L (ref 3.5–5.1)
Potassium: 3.4 mmol/L — ABNORMAL LOW (ref 3.5–5.1)
Sodium: 129 mmol/L — ABNORMAL LOW (ref 135–145)
Sodium: 127 mmol/L — ABNORMAL LOW (ref 135–145)

## 2018-12-31 LAB — CBC WITH DIFFERENTIAL/PLATELET
Abs Immature Granulocytes: 0.15 10*3/uL — ABNORMAL HIGH (ref 0.00–0.07)
Basophils Absolute: 0 10*3/uL (ref 0.0–0.1)
Basophils Relative: 0 %
EOS ABS: 0.3 10*3/uL (ref 0.0–0.5)
Eosinophils Relative: 2 %
HCT: 42.8 % (ref 39.0–52.0)
Hemoglobin: 14.4 g/dL (ref 13.0–17.0)
Immature Granulocytes: 1 %
Lymphocytes Relative: 16 %
Lymphs Abs: 2.4 10*3/uL (ref 0.7–4.0)
MCH: 32.6 pg (ref 26.0–34.0)
MCHC: 33.6 g/dL (ref 30.0–36.0)
MCV: 96.8 fL (ref 80.0–100.0)
Monocytes Absolute: 2.6 10*3/uL — ABNORMAL HIGH (ref 0.1–1.0)
Monocytes Relative: 17 %
Neutro Abs: 9.8 10*3/uL — ABNORMAL HIGH (ref 1.7–7.7)
Neutrophils Relative %: 64 %
Platelets: 353 10*3/uL (ref 150–400)
RBC: 4.42 MIL/uL (ref 4.22–5.81)
RDW: 12.4 % (ref 11.5–15.5)
WBC: 15.3 10*3/uL — ABNORMAL HIGH (ref 4.0–10.5)
nRBC: 0 % (ref 0.0–0.2)

## 2018-12-31 LAB — NOVEL CORONAVIRUS, NAA (HOSP ORDER, SEND-OUT TO REF LAB; TAT 18-24 HRS): SARS-CoV-2, NAA: NOT DETECTED

## 2018-12-31 LAB — PHOSPHORUS: Phosphorus: 4.2 mg/dL (ref 2.5–4.6)

## 2018-12-31 LAB — MAGNESIUM: Magnesium: 2 mg/dL (ref 1.7–2.4)

## 2018-12-31 MED ORDER — SODIUM CHLORIDE 1 G PO TABS
1.0000 g | ORAL_TABLET | Freq: Two times a day (BID) | ORAL | Status: DC
  Administered 2018-12-31 – 2019-01-03 (×7): 1 g via ORAL
  Filled 2018-12-31 (×8): qty 1

## 2018-12-31 NOTE — Progress Notes (Signed)
NAME:  Eugene Christensen, MRN:  [id number], DOB:  Feb 26, 1966, LOS: 9 ADMISSION DATE:  12/22/2018, CONSULTATION DATE:  12/22/2018 REFERRING MD:  Dr Rhona Leavens, CHIEF COMPLAINT:  Worsening resp failure iwht infiltrates since admission 5d earlier    History of present illness   52 year from [address] [facility] [place] [address] -  with copd, bp but indepdent ADL. On 12/19/2018 Friday tripped and  fell down going up stairs and felt pain on left side of chest. Says prior to that was feeling baseline. After that only intense left sided pain that presented through ER visit 12/22/2018. durring this he had no myalgia, fatigue or flu like symptoms. But due to persistent pain and related dyspnea he decided to go to ER. Just prior to ER arrival he felt brief chills.  In ER3/06/2019 (no CBC data at admit avialable)  was afebrile at 25F. D-dimer was high 2.41 but PE reuled out. Ruled out standard RVP and flu panel and urine streo. CTA shows patch LLL infiltrates (also showed -> Ununited fracture of the posterolateral aspect right sixth rib. Old healed right eleventh rib fracture - CONTRALATERAL TO PAIN from Fall.) . He was hypoxemic needing 6LNC at arrival but by time of admit weaned down to 2L [employer] and was wheesy.  COVIDROME profile  - history:  initial symptoms are not c.w COVID - travel: none - sick cluster exposure: none (works at [employer], last worked 2d prior to fall and does not remember travelers from beyond [place]). No sick family cluster - low WBC at admit - data not available, currently high on steroids  -PCT:  < 0.1 and can be c/w Covid - Alternative diagnosis: None available and so raises question of covid - Course: Worsening infiltrates and can be c/w Covid  Past Medical History  HTN Significant Hospital Events   12/22/2018 - admit 3/14 - SDU transfer for worsening hypoxemia 3/10 - Hospitalist noted: Breathing more easily than at admission but still short of breath even a little at rest which is not his baseline.  Does not experience shortness of breath at baseline even with moderate walking. Required 4L O2 to maintain SpO2 89% when ambulating with PT today. No new chest pain, left-sided rib pain is stable.  3/14 - RN noted: Pt sitting up in bed with non-rebreather on and O2 saturation 88%. Alert and oriented x4. No accessory muscle use and able to speak in full sentences without having to catch breath. O2 saturations up to 96%. Non-rebreather removed and HFNC reapplied at 15L. O2 sats remained 94%. Saturations did drop with exertion to 88% but able to recover back up to 94%. Paient transfer to SDU and ccm consul]ed. CXR with worsening bilateral infiltrates 3/15: He feels better.  Infiltrates improved, still on high flow oxygen, and nasal cannula, nasal cannula moved to 10 L down from 15 3/18: There is testing still pending now day 4.  Now down to 2 L.  Infiltrates improving overall has improved Consults:  3/14 - CCM  Procedures:    Significant Diagnostic Tests:    Micro Data:  3/9 - flu pcr neg 3/9 - rvp - neg 3/9 urine strep -neg 3/9 - urine tox -opioids osive  Antimicrobials:      Interim history/subjective:  Feels better  Objective   Blood pressure 114/73, pulse (Abnormal) 105, temperature 98.6 F (37 C), temperature source Oral, resp. rate 18, height 5\' 11"  (1.803 m), weight 62.4 kg, SpO2 95 %.  Intake/Output Summary (Last 24 hours) at 12/31/2018 0844 Last data filed at 12/31/2018 0500 Gross per 24 hour  Intake 97.8 ml  Output 1675 ml  Net -1577.2 ml   Filed Weights   12/22/18 1600 12/27/18 1040  Weight: 68 kg 62.4 kg    Examination:  General: 53 year old male patient sitting up in bed he is able to speak in full sentences and is now down to 2 L nasal cannula he is in no distress and shows no accessory use Cardiac: Regular rate and rhythm without murmur rub or gallop Pulmonary: Diminished bases no accessory use no wheeze Abdomen: Soft, not tender no organomegaly  Extremities: Warm and dry brisk capillary refill strong pulses Neuro: Awake oriented no focal deficits GU: Voiding  Resolved Hospital Problem list     Assessment & Plan:  Acute Hypoxemic Resp failure with worsening infiltrates  - Low Prob for COVID. He has worsening infilrates over 9 days and hypoxemia Pct nml.  -crp: elevated, LDH elevated, ferritin elevated (all seen w/ COVID)  This makes it concering for COVID19 but initial history and exposure/travel is not consistent. This could also be ALI from fall and/or acute CHF Nos PCXR personally reviewed.  Comparing over the last 48 hours demonstrates improved aeration bilaterally  -azithromycin was discontinued at day 9  plan We continue to await the coronavirus test  Continue respiratory isolation  Diuresis long as BUN and creatinine allow  Day #10 ceftriaxone, deferring stop date to infectious disease Cont N95 if giving nebulized therapy OR needs high flow OR BIPAP   Best practice:  Diet: per [facility] Pain/Anxiety/Delirium protocol (if indicated): per [facility] VAP protocol (if indicated): per tiad DVT prophylaxis: per [facility] GI prophylaxis: per [facility]ad Glucose control: per [facility] Mobility: bed rest; will advance to bathroom privilege and OOB Code Status: full Family Communication: patient updated at bedside. Disposition: cont droplet isolation and contact isolation. (N95 mask for procedures or aerolized procedures)  -Continues to improve.  Nothing additional to offer at this point.  We will be available on an as-needed basis   Simonne Martinet ACNP-BC [facility] Pulmonary/Critical Care Pager # [phone] OR # [phone] if no answer

## 2018-12-31 NOTE — Progress Notes (Signed)
INFECTIOUS DISEASE PROGRESS NOTE  ID: Eugene Christensen is a 53 y.o. male with  Principal Problem:   Community acquired pneumonia of left lower lobe of lung (HCC) Active Problems:   Multiple rib fractures   COPD with acute exacerbation (HCC)   Essential hypertension  Subjective: Feels better, denies sob  Abtx:  Anti-infectives (From admission, onward)   Start     Dose/Rate Route Frequency Ordered Stop   12/22/18 1345  cefTRIAXone (ROCEPHIN) 1 g in sodium chloride 0.9 % 100 mL IVPB  Status:  Discontinued     1 g 200 mL/hr over 30 Minutes Intravenous Every 24 hours 12/22/18 1331 12/22/18 1332   12/22/18 1345  azithromycin (ZITHROMAX) 500 mg in sodium chloride 0.9 % 250 mL IVPB  Status:  Discontinued     500 mg 250 mL/hr over 60 Minutes Intravenous Every 24 hours 12/22/18 1331 12/22/18 1332   12/22/18 1245  cefTRIAXone (ROCEPHIN) 2 g in sodium chloride 0.9 % 100 mL IVPB     2 g 200 mL/hr over 30 Minutes Intravenous Every 24 hours 12/22/18 1230     12/22/18 1245  azithromycin (ZITHROMAX) 500 mg in sodium chloride 0.9 % 250 mL IVPB  Status:  Discontinued     500 mg 250 mL/hr over 60 Minutes Intravenous Every 24 hours 12/22/18 1230 12/30/18 1059   12/22/18 1245  azithromycin (ZITHROMAX) 500 MG injection    Note to Pharmacy:  Gertie Baron   : cabinet override      12/22/18 1245 12/23/18 0059      Medications:  Scheduled: . alfuzosin  10 mg Oral Daily  . calcium carbonate  1 tablet Oral Q breakfast  . Chlorhexidine Gluconate Cloth  6 each Topical Daily  . finasteride  5 mg Oral Daily  . folic acid  1 mg Oral Daily  . furosemide  40 mg Intravenous Q12H  . guaiFENesin  600 mg Oral BID  . mouth rinse  15 mL Mouth Rinse BID  . multivitamin with minerals  1 tablet Oral Daily  . nicotine  21 mg Transdermal Daily  . pantoprazole  40 mg Oral Daily  . polyethylene glycol  17 g Oral Daily  . sertraline  50 mg Oral Daily  . sodium chloride  1 g Oral BID WC  . thiamine  100 mg Oral Daily    Or  . thiamine  100 mg Intravenous Daily  . traZODone  50 mg Oral QHS  . umeclidinium bromide  1 puff Inhalation Daily    Objective: Vital signs in last 24 hours: Temp:  [97.7 F (36.5 C)-98.8 F (37.1 C)] 98.2 F (36.8 C) (03/18 1140) Resp:  [8-24] 19 (03/18 1300) BP: (114-155)/(55-97) 147/85 (03/18 1300) SpO2:  [91 %-100 %] 93 % (03/18 1300)   General appearance: alert, cooperative and no distress Resp: diminished breath sounds anterior - bilateral Cardio: regular rate and rhythm GI: normal findings: bowel sounds normal and soft, non-tender Extremities: edema none  Lab Results Recent Labs    12/30/18 0510 12/31/18 0304 12/31/18 1220  WBC 12.6* 15.3*  --   HGB 13.8 14.4  --   HCT 40.6 42.8  --   NA 132* 129* 127*  K 3.8 3.8 3.4*  CL 91* 88* 83*  CO2 32 30 31  BUN 13 15 13   CREATININE 0.65 0.74 0.63   Liver Panel Recent Labs    12/29/18 0418  PROT 6.8  ALBUMIN 3.1*  AST 19  ALT 16  ALKPHOS 51  BILITOT 1.2   Sedimentation Rate No results for input(s): ESRSEDRATE in the last 72 hours. C-Reactive Protein No results for input(s): CRP in the last 72 hours.  Microbiology: Recent Results (from the past 240 hour(s))  Blood Culture (routine x 2)     Status: None   Collection Time: 12/22/18 12:45 PM  Result Value Ref Range Status   Specimen Description   Final    BLOOD RIGHT ANTECUBITAL Performed at [facility], [address] [place], [place] [address]    Special Requests   Final    BOTTLES DRAWN AEROBIC AND ANAEROBIC Blood Culture adequate volume Performed at [facility], [address] [place], [place] [address]    Culture   Final    NO GROWTH 5 DAYS Performed at [facility], [address], [place], [place] [address]    Report Status 12/27/2018 FINAL  Final  Blood Culture (routine x 2)     Status: None   Collection Time: 12/22/18  1:10 PM  Result Value Ref Range Status   Specimen Description   Final    BLOOD  LEFT FOREARM Performed at [facility], [address] [place], [place] [address]    Special Requests   Final    BOTTLES DRAWN AEROBIC AND ANAEROBIC Blood Culture adequate volume Performed at [facility], [address] [place], [place] [address]    Culture   Final    NO GROWTH 5 DAYS Performed at [facility], [address], [place], [place] [address]    Report Status 12/27/2018 FINAL  Final  Respiratory Panel by PCR     Status: None   Collection Time: 12/22/18  3:41 PM  Result Value Ref Range Status   Adenovirus NOT DETECTED NOT DETECTED Final   Coronavirus 229E NOT DETECTED NOT DETECTED Final    Comment: (NOTE) The Coronavirus on the Respiratory Panel, DOES NOT test for the novel  Coronavirus (2019 nCoV)    Coronavirus HKU1 NOT DETECTED NOT DETECTED Final   Coronavirus NL63 NOT DETECTED NOT DETECTED Final   Coronavirus OC43 NOT DETECTED NOT DETECTED Final   Metapneumovirus NOT DETECTED NOT DETECTED Final   Rhinovirus / Enterovirus NOT DETECTED NOT DETECTED Final   Influenza A NOT DETECTED NOT DETECTED Final   Influenza B NOT DETECTED NOT DETECTED Final   Parainfluenza Virus 1 NOT DETECTED NOT DETECTED Final   Parainfluenza Virus 2 NOT DETECTED NOT DETECTED Final   Parainfluenza Virus 3 NOT DETECTED NOT DETECTED Final   Parainfluenza Virus 4 NOT DETECTED NOT DETECTED Final   Respiratory Syncytial Virus NOT DETECTED NOT DETECTED Final   Bordetella pertussis NOT DETECTED NOT DETECTED Final   Chlamydophila pneumoniae NOT DETECTED NOT DETECTED Final   Mycoplasma pneumoniae NOT DETECTED NOT DETECTED Final    Comment: Performed at [facility], [address] [place], [place] [address]  MRSA PCR Screening     Status: None   Collection Time: 12/27/18 11:05 AM  Result Value Ref Range Status   MRSA by PCR NEGATIVE NEGATIVE Final    Comment:        The GeneXpert MRSA Assay (FDA approved for NASAL specimens only), is one component of a  comprehensive MRSA colonization surveillance program. It is not intended to diagnose MRSA infection nor to guide or monitor treatment for MRSA infections. Performed at [facility], [address] [place], [place] [address]   Culture, blood (  routine x 2) Call MD if unable to obtain prior to antibiotics being given     Status: None (Preliminary result)   Collection Time: 12/27/18 11:40 AM  Result Value Ref Range Status   Specimen Description   Final    BLOOD LEFT ARM Performed at [facility], [address] [place], [place] [address]    Special Requests   Final    BOTTLES DRAWN AEROBIC ONLY Blood Culture adequate volume Performed at [facility], [address], [place], [place] [address]    Culture   Final    NO GROWTH 4 DAYS Performed at [facility], [address], [place], [place] [address]    Report Status PENDING  Incomplete  Culture, blood (routine x 2) Call MD if unable to obtain prior to antibiotics being given     Status: None (Preliminary result)   Collection Time: 12/27/18 11:40 AM  Result Value Ref Range Status   Specimen Description   Final    BLOOD RIGHT ARM Performed at [facility], [address] [place], [place] [address]    Special Requests   Final    BOTTLES DRAWN AEROBIC ONLY Blood Culture results may not be optimal due to an inadequate volume of blood received in culture bottles Performed at [facility], [address], [place], [place] [address]    Culture   Final    NO GROWTH 4 DAYS Performed at [facility], [address] [place], [place] [address]    Report Status PENDING  Incomplete    Studies/Results: Dg Chest Port 1 View  Result Date: 12/31/2018 CLINICAL DATA:  Abnormal respiration EXAM: PORTABLE CHEST 1 VIEW COMPARISON:  Two days ago FINDINGS: Interstitial opacity is patchy on the right and more generalized and hazy on the left. Hyperinflation and apical  lucency. There is volume loss and more dense opacity in the retrocardiac lung where there was airway impaction by prior CT. IMPRESSION: 1. History of pneumonia with mild increase in left lung opacity. 2. Emphysema.  Lower lobe mucoid impaction on recent chest CT. Electronically Signed   By: Marnee Spring M.D.   On: 12/31/2018 06:58     Assessment/Plan: COPD LLL infiltrate Rib fracture 6th, post fall  Total days of antibiotics:10 ceftriaxone              Will stop ceftriaxone                                                 Await COVID testing although this seems unlikely. Spoke with [facility]- test will be resulted on 3-21 [phone] (acct [phone])  Agree with plans to wean off O2 (now off, although tachycardic), allow pt to complete his quarantine at home.  Making gradual improvement.          Johny Sax MD, FACP Infectious Diseases (pager) [phone] [email or web address] 12/31/2018, 2:11 PM  LOS: 9 days

## 2018-12-31 NOTE — Progress Notes (Signed)
PROGRESS NOTE  Frederica KusterJames Mcloughlin WUJ:[id number] DOB: [birth year] DOA: 12/22/2018 [facility]  Brief History   53 y.o.malewith a history of tobacco use, alcohol use, and HTN who presented to the ED with increasing dyspnea that started a few days prior, shortly after he tripped going up stairs at his parents' house, falling backwards with resultant significant left-sided chest pain with breathing. He was found to have increased work of breathing, wheezing, and continued to have hypoxia despite continuous nebs. CXR revealed right 6th rib fracture without significant PTX or infiltrate, subsequent CTA chest was performed showing developing LLL pneumonia and fracture of 6th right rib. . Due to ongoing hypoxia, admission was requested.By admission the patient's O2 requirements had decreased from 6L to 2L. However, on 12/27/2018, the patient had an abrupt increase in oxygen requirements. He was then oxygenating at 94% on 15 L. He had worsening infiltrates on CXR. He was transferred to SDU, and PCCM was consulted. His oxygen requirements have been declining since then. He has been tested for COVID-19. These results are pending and are not expected until 01/01/2019. However, ID has been consulted and based upon the patient's risk profile the patient's isolation profile has been decreased to droplet and contact precautions from airborne.  A & P   Problem  [place] (Adult Respiratory Distress Syndrome) (Hcc)  Acute Respiratory Failure With Hypoxia (Hcc)  Community Acquired Pneumonia of Left Lower Lobe of Lung (Hcc)  Multiple Rib Fractures  Copd With Acute Exacerbation (Hcc)  Essential Hypertension    Acute hypoxic respiratory failure: Improving slowly.   ARDS: Per PCCM. Their assistance is greatly appreciated. Improving with diuresis.   LLL Pneumonia: The patient has completed 8 days of azithromycin and 2 days of Rocephin. Infectious disease is following. CXR is improving.  COPD exacerbation:  Mucinex, continue bronchodilator therapy and Incruse ellipta  Tobacco use: Tobacco cessation counseling given. On nicotine patch.  Right 6th rib fracture: Incentive spirometry. Contributing to development of pneumonia. Oxicodone IR for pain control.  Essential hypertension: Blood pressures are well controlled currently on proscar and lasix. Monitor.  DVT prophylaxis: Lovenox Code Status: Full code Family Communication: No family at bedside Disposition Plan: tbd   Azaria Bartell, DO [facility] Direct contact: see [email or web address]  7PM-7AM contact night coverage as above 12/31/2018, 2:41 PM  LOS: 9 days   Consultants  . PCCM, Infectous Disease  Procedures  . Non  Antibiotics      Start        Dose/Rate  Route  Frequency  Ordered  Stop     12/22/18 1345    cefTRIAXone (ROCEPHIN) 1 g in sodium chloride 0.9 % 100 mL IVPB  Status:  Discontinued       1 g  200 mL/hr over 30 Minutes  Intravenous  Every 24 hours  12/22/18 1331  12/22/18 1332     12/22/18 1345    azithromycin (ZITHROMAX) 500 mg in sodium chloride 0.9 % 250 mL IVPB  Status:  Discontinued       500 mg  250 mL/hr over 60 Minutes  Intravenous  Every 24 hours  12/22/18 1331  12/22/18 1332     12/22/18 1245    cefTRIAXone (ROCEPHIN) 2 g in sodium chloride 0.9 % 100 mL IVPB       2 g  200 mL/hr over 30 Minutes  Intravenous  Every 24 hours  12/22/18 1230        12/22/18 1245    azithromycin (ZITHROMAX) 500 mg in sodium  chloride 0.9 % 250 mL IVPB  Status:  Discontinued       500 mg  250 mL/hr over 60 Minutes  Intravenous  Every 24 hours  12/22/18 1230  12/30/18 1059     12/22/18 1245    azithromycin (ZITHROMAX) 500 MG injection     Note to Pharmacy:  Gertie Baron   : cabinet override           12/22/18 1245  12/23/18 0059      .   Interval History/Subjective  52 y.o.malewith a history of tobacco use, alcohol use, and HTN who presented to  the ED with increasing dyspnea that started a few days prior, shortly after he tripped going up stairs at his parents' house, falling backwards with resultant significant left-sided chest pain with breathing. He was found to have increased work of breathing, wheezing, and continued to have hypoxia despite continuous nebs. CXR revealed right 6th rib fracture without significant PTX or infiltrate, subsequent CTA chest was performed showing developing LLL pneumonia and fracture of 6th right rib. . Due to ongoing hypoxia, admission was requested.By admission the patient's O2 requirements had decreased from 6L to 2L. However, on 12/27/2018, the patient had an abrupt increase in oxygen requirements. He was then oxygenating at 94% on 15 L. He had worsening infiltrates on CXR. He was transferred to SDU, and PCCM was consulted. His oxygen requirements have been declining since then. He has been tested for COVID-19. These results are pending and are not expected until 01/01/2019. However, ID has been consulted and based upon the patient's risk profile the patient's isolation profile has been decreased to droplet and contact precautions from airborne.  The patient is sitting up in bed upon my visit. He is in no acute distress.  Objective   Vitals:  Vitals:   12/31/18 1200 12/31/18 1300  BP: (!) 138/93 (!) 147/85  Pulse:    Resp: (!) 24 19  Temp:    SpO2: 92% 93%    Exam:  Constitutional:  . Appears calm and comfortable Respiratory:  . CTA bilaterally, no wheezes, rales, or rhonci. . No tactile fremitus. . No increased work of breathing. Cardiovascular:  . RRR, no murmurs, ectopy, or gallups. . No lateral PMI. No thrills. . Normal pedal pulses . No edema. Abdomen:  . Abdomen non-tender, non-distended . Normoactive bowel sounds. . No hernias, masses, or organomegaly are appreciated Musculoskeletal:  . No cyanosis, clubbing, or edema Skin:  . No rashes, lesions, ulcers . palpation of skin: no  induration or nodules Neurologic:  . CN 2-12 intact . Sensation all 4 extremities intact Psychiatric:  . Mental status o Mood, affect appropriate o Orientation to person, place, time  . judgment and insight appear intact     I have personally reviewed the following:   Today's Data  . CBC, Cultures, COVID-19 testing - pending, RVP, BMP, Vitals  Imaging  . LLL pneumonia with mild increasing in left lung opacity with lower lobe mucoid impaction on recent CT chest.  Cardiology Data  . Echo: Normal EF. Normal diastolic function  Scheduled Meds: . alfuzosin  10 mg Oral Daily  . calcium carbonate  1 tablet Oral Q breakfast  . Chlorhexidine Gluconate Cloth  6 each Topical Daily  . finasteride  5 mg Oral Daily  . folic acid  1 mg Oral Daily  . furosemide  40 mg Intravenous Q12H  . guaiFENesin  600 mg Oral BID  . mouth rinse  15 mL Mouth Rinse  BID  . multivitamin with minerals  1 tablet Oral Daily  . nicotine  21 mg Transdermal Daily  . pantoprazole  40 mg Oral Daily  . polyethylene glycol  17 g Oral Daily  . sertraline  50 mg Oral Daily  . sodium chloride  1 g Oral BID WC  . thiamine  100 mg Oral Daily   Or  . thiamine  100 mg Intravenous Daily  . traZODone  50 mg Oral QHS  . umeclidinium bromide  1 puff Inhalation Daily   Continuous Infusions:  Principal Problem:   Community acquired pneumonia of left lower lobe of lung (HCC) Active Problems:   Multiple rib fractures   COPD with acute exacerbation (HCC)   Essential hypertension  Community acquired pneumo  LOS: 9 days

## 2018-12-31 NOTE — Progress Notes (Addendum)
COVID 19 test negative. Pt removed from isolation.  KJKG, NP [facility] Update: After review of chart and rounding notes, will leave pt on droplet until am and remove contact isolation. Asked to eval pt for move to tele. VS reviewed. No respiratory difficulties, on room air. NP believes pt is safe to go to tele under droplet precautions.  KJKG, NP [facility]

## 2019-01-01 LAB — CULTURE, BLOOD (ROUTINE X 2)
Culture: NO GROWTH
Culture: NO GROWTH
Special Requests: ADEQUATE

## 2019-01-01 NOTE — Care Management Important Message (Signed)
Important Message  Patient Details  Name: Eugene Christensen MRN: [id number] Date of Birth: January 12, 1966   Medicare Important Message Given:  Yes    Caren Macadam 01/01/2019, 11:55 AMImportant Message  Patient Details  Name: Eugene Christensen MRN: [id number] Date of Birth: Aug 05, 1966   Medicare Important Message Given:  Yes    Caren Macadam 01/01/2019, 11:55 AM

## 2019-01-01 NOTE — Progress Notes (Signed)
Writer assumed care of this patient at 1600.  I agree with the previous nurses assessment.   

## 2019-01-01 NOTE — Evaluation (Signed)
Physical Therapy One Time Evaluation Patient Details Name: Eugene Christensen MRN: [id number] DOB: May 12, 1966 Today's Date: 01/01/2019   History of Present Illness  Brandom Sciarrino is a 53 y.o. male with PMH of tobacco use and HTN admitted for evaluation of difficulty breathing and left chest pain.  He had fall on stairs recently and x-rays show rib fx..  Pt admitted for acute hypoxic respiratory failure secondary to COPD with exacerbation, CAP, and splinting from rib fracture.  Pt with ARDS and transferred to SDU however now currently back on telemetry unit  Clinical Impression  Patient evaluated by Physical Therapy with no further acute PT needs identified. All education has been completed and the patient has no further questions.  Pt's mobility and endurance much improved since visit last week.  Pt also denies dyspnea however did require at least 2L O2 during ambulation (see below).  Pt encouraged to ambulate with nursing staff and agreeable to no acute follow up PT needs at this time. See below for any follow-up Physical Therapy or equipment needs. PT is signing off. Thank you for this referral.   Follow Up Recommendations No PT follow up    Equipment Recommendations  None recommended by PT    Recommendations for Other Services       Precautions / Restrictions Precautions Precautions: Other (comment) Precaution Comments: monitor o2      Mobility  Bed Mobility Overal bed mobility: Modified Independent                Transfers Overall transfer level: Modified independent                  Ambulation/Gait Ambulation/Gait assistance: Supervision Gait Distance (Feet): 240 Feet Assistive device: None Gait Pattern/deviations: Step-through pattern;Decreased stride length     General Gait Details: pt denies dyspnea and able to tolerate improved distance since previously seen last week however HR elevated to 144 bpm and Spo2 down to 83% on room air so reapplied 2L O2 [place] and SPO2  improved 88%  Stairs            Wheelchair Mobility    Modified Rankin (Stroke Patients Only)       Balance                                             Pertinent Vitals/Pain Pain Assessment: No/denies pain    Home Living Family/patient expects to be discharged to:: Private residence Living Arrangements: Parent Available Help at Discharge: Family   Home Access: Stairs to enter Entrance Stairs-Rails: Right Entrance Stairs-Number of Steps: 3 Home Layout: Two level Home Equipment: Shower seat;Grab bars - tub/shower Additional Comments: has a sink next to commode    Prior Function Level of Independence: Independent with assistive device(s)         Comments: Amb with cane a few days a week for back pain.      Hand Dominance        Extremity/Trunk Assessment        Lower Extremity Assessment Lower Extremity Assessment: Overall WFL for tasks assessed    Cervical / Trunk Assessment Cervical / Trunk Assessment: Normal  Communication   Communication: No difficulties  Cognition Arousal/Alertness: Awake/alert Behavior During Therapy: WFL for tasks assessed/performed Overall Cognitive Status: Within Functional Limits for tasks assessed  General Comments      Exercises     Assessment/Plan    PT Assessment Patent does not need any further PT services  PT Problem List         PT Treatment Interventions      PT Goals (Current goals can be found in the Care Plan section)  Acute Rehab PT Goals PT Goal Formulation: All assessment and education complete, DC therapy    Frequency     Barriers to discharge        Co-evaluation               AM-PAC PT "6 Clicks" Mobility  Outcome Measure Help needed turning from your back to your side while in a flat bed without using bedrails?: None Help needed moving from lying on your back to sitting on the side of a flat bed  without using bedrails?: None Help needed moving to and from a bed to a chair (including a wheelchair)?: None Help needed standing up from a chair using your arms (e.g., wheelchair or bedside chair)?: None Help needed to walk in hospital room?: A Little Help needed climbing 3-5 steps with a railing? : A Little 6 Click Score: 22    End of Session Equipment Utilized During Treatment: Oxygen Activity Tolerance: Patient tolerated treatment well Patient left: in bed;with call bell/phone within reach Nurse Communication: Mobility status PT Visit Diagnosis: Difficulty in walking, not elsewhere classified (R26.2)    Time: 4935-5217 PT Time Calculation (min) (ACUTE ONLY): 17 min   Charges:   PT Evaluation $PT Re-evaluation: 1 Re-eval         Zenovia Jarred, PT, DPT Acute Rehabilitation Services Office: [phone] Pager: [phone]  Sarajane Jews 01/01/2019, 3:32 PM

## 2019-01-01 NOTE — Progress Notes (Signed)
PROGRESS NOTE  Eugene Christensen THY:[id number] DOB: Nov 09, 1965 DOA: 12/22/2018 PCP: [facility]  Brief History   53 y.o.malewith a history of tobacco use, alcohol use, and HTN who presented to the ED with increasing dyspnea that started a few days prior, shortly after he tripped going up stairs at his parents' house, falling backwards with resultant significant left-sided chest pain with breathing. He was found to have increased work of breathing, wheezing, and continued to have hypoxia despite continuous nebs. CXR revealed right 6th rib fracture without significant PTX or infiltrate, subsequent CTA chest was performed showing developing LLL pneumonia and fracture of 6th right rib. . Due to ongoing hypoxia, admission was requested.By admission the patient's O2 requirements had decreased from 6L to 2L. However, on 12/27/2018, the patient had an abrupt increase in oxygen requirements. He was then oxygenating at 94% on 15 L. He had worsening infiltrates on CXR. He was transferred to SDU, and PCCM was consulted. His oxygen requirements have been declining since then. He has been tested for COVID-19. These results are pending and are not expected until 01/01/2019. However, ID has been consulted and based upon the patient's risk profile the patient's isolation profile has been decreased to droplet and contact precautions from airborne.   Consultants   PCCM, Infectous Disease  Procedures   Non  Antibiotics      Start        Dose/Rate  Route  Frequency  Ordered  Stop     12/22/18 1345    cefTRIAXone (ROCEPHIN) 1 g in sodium chloride 0.9 % 100 mL IVPB  Status:  Discontinued       1 g  200 mL/hr over 30 Minutes  Intravenous  Every 24 hours  12/22/18 1331  12/22/18 1332     12/22/18 1345    azithromycin (ZITHROMAX) 500 mg in sodium chloride 0.9 % 250 mL IVPB  Status:  Discontinued       500 mg  250 mL/hr over 60 Minutes  Intravenous  Every 24 hours  12/22/18  1331  12/22/18 1332     12/22/18 1245    cefTRIAXone (ROCEPHIN) 2 g in sodium chloride 0.9 % 100 mL IVPB       2 g  200 mL/hr over 30 Minutes  Intravenous  Every 24 hours  12/22/18 1230        12/22/18 1245    azithromycin (ZITHROMAX) 500 mg in sodium chloride 0.9 % 250 mL IVPB  Status:  Discontinued       500 mg  250 mL/hr over 60 Minutes  Intravenous  Every 24 hours  12/22/18 1230  12/30/18 1059     12/22/18 1245    azithromycin (ZITHROMAX) 500 MG injection     Note to Pharmacy:  Gertie Baron   : cabinet override           12/22/18 1245  12/23/18 0059         Interval History/Subjective  52 y.o.malewith a history of tobacco use, alcohol use, and HTN who presented to the ED with increasing dyspnea that started a few days prior, shortly after he tripped going up stairs at his parents' house, falling backwards with resultant significant left-sided chest pain with breathing. He was found to have increased work of breathing, wheezing, and continued to have hypoxia despite continuous nebs. CXR revealed right 6th rib fracture without significant PTX or infiltrate, subsequent CTA chest was performed showing developing LLL pneumonia and fracture of 6th right rib. . Due to  ongoing hypoxia, admission was requested.By admission the patient's O2 requirements had decreased from 6L to 2L. However, on 12/27/2018, the patient had an abrupt increase in oxygen requirements. He was then oxygenating at 94% on 15 L. He had worsening infiltrates on CXR. He was transferred to SDU, and PCCM was consulted. His oxygen requirements have been declining since then. He has been tested for COVID-19. These results are pending and are not expected until 01/01/2019. However, ID has been consulted and based upon the patient's risk profile the patient's isolation profile has been decreased to droplet and contact precautions from airborne.  The patient is sitting up in bed upon my visit.  He is in no acute distress. He has been transferred out of the ICU.  Objective   Vitals:  Vitals:   01/01/19 0833 01/01/19 1243  BP:  123/80  Pulse:  89  Resp:  (!) 22  Temp:  98.3 F (36.8 C)  SpO2: 92% (!) 84%    Exam:  Constitutional:   Appears calm and comfortable Respiratory:   CTA bilaterally, no wheezes, rales, or rhonci.  No tactile fremitus.  No increased work of breathing. Cardiovascular:   RRR, no murmurs, ectopy, or gallups.  No lateral PMI. No thrills.  Normal pedal pulses  No edema. Abdomen:   Abdomen non-tender, non-distended  Normoactive bowel sounds.  No hernias, masses, or organomegaly are appreciated Musculoskeletal:   No cyanosis, clubbing, or edema Skin:   No rashes, lesions, ulcers  palpation of skin: no induration or nodules Neurologic:   CN 2-12 intact  Sensation all 4 extremities intact Psychiatric:   Mental status o Mood, affect appropriate o Orientation to person, place, time   judgment and insight appear intact     I have personally reviewed the following:   Today's Data   CBC, Cultures, COVID-19 testing - pending, RVP, BMP, Vitals  Imaging   LLL pneumonia with mild increasing in left lung opacity with lower lobe mucoid impaction on recent CT chest.  Cardiology Data   Echo: Normal EF. Normal diastolic function  Scheduled Meds:  alfuzosin  10 mg Oral Daily   calcium carbonate  1 tablet Oral Q breakfast   Chlorhexidine Gluconate Cloth  6 each Topical Daily   finasteride  5 mg Oral Daily   folic acid  1 mg Oral Daily   furosemide  40 mg Intravenous Q12H   guaiFENesin  600 mg Oral BID   mouth rinse  15 mL Mouth Rinse BID   multivitamin with minerals  1 tablet Oral Daily   nicotine  21 mg Transdermal Daily   pantoprazole  40 mg Oral Daily   polyethylene glycol  17 g Oral Daily   sertraline  50 mg Oral Daily   sodium chloride  1 g Oral BID WC   thiamine  100 mg Oral Daily   Or    thiamine  100 mg Intravenous Daily   traZODone  50 mg Oral QHS   umeclidinium bromide  1 puff Inhalation Daily   Continuous Infusions:  Active Problems:   Community acquired pneumonia of left lower lobe of lung (HCC)   Multiple rib fractures   COPD with acute exacerbation (HCC)   Essential hypertension   ARDS (adult respiratory distress syndrome) (HCC)   Acute respiratory failure with hypoxia (HCC)   A & P   Acute hypoxic respiratory failure: Improving slowly. The patient continues to desaturate to the low to mid 80's on room air. No prior need for O2. Continue  to try to wean.   ARDS: Per PCCM. Their assistance is greatly appreciated. Improving with diuresis.   Hyponatremia: Follow chemistry. Possibly overdiuresis.  LLL Pneumonia: The patient has completed 8 days of azithromycin and 2 days of Rocephin. Infectious disease is following. CXR is improving.  COPD exacerbation: Mucinex, continue bronchodilator therapy and Incruse ellipta. COVOD-19 testing is negative.  Tobacco use: Tobacco cessation counseling given. On nicotine patch.  Right 6th rib fracture: Incentive spirometry. Contributing to development of pneumonia. Oxicodone IR for pain control.  Essential hypertension: Blood pressures are well controlled currently on proscar and lasix. Monitor.  I have seen and examined this patient myself. I have spent 35 minutes in his evaluation and care.  DVT prophylaxis: Lovenox Code Status: Full code Family Communication: No family at bedside Disposition Plan: tbd  Kaylie Ritter, DO [facility] Direct contact: see [email or web address]  7PM-7AM contact night coverage as above 01/01/2019, 2:09 PM   LOS: 10 days

## 2019-01-02 DIAGNOSIS — E871 Hypo-osmolality and hyponatremia: Secondary | ICD-10-CM

## 2019-01-02 LAB — BASIC METABOLIC PANEL
Anion gap: 10 (ref 5–15)
BUN: 8 mg/dL (ref 6–20)
CO2: 33 mmol/L — ABNORMAL HIGH (ref 22–32)
Calcium: 8.5 mg/dL — ABNORMAL LOW (ref 8.9–10.3)
Chloride: 86 mmol/L — ABNORMAL LOW (ref 98–111)
Creatinine, Ser: 0.72 mg/dL (ref 0.61–1.24)
GFR calc Af Amer: >60 mL/min (ref >60)
GFR calc non Af Amer: >60 mL/min (ref >60)
Glucose, Bld: 96 mg/dL (ref 70–99)
Potassium: 3 mmol/L — ABNORMAL LOW (ref 3.5–5.1)
Sodium: 129 mmol/L — ABNORMAL LOW (ref 135–145)

## 2019-01-02 MED ORDER — POTASSIUM CHLORIDE CRYS ER 20 MEQ PO TBCR
40.0000 meq | EXTENDED_RELEASE_TABLET | Freq: Once | ORAL | Status: AC
  Administered 2019-01-02: 40 meq via ORAL
  Filled 2019-01-02: qty 2

## 2019-01-02 MED ORDER — FUROSEMIDE 40 MG PO TABS
40.0000 mg | ORAL_TABLET | Freq: Two times a day (BID) | ORAL | Status: DC
  Administered 2019-01-02 – 2019-01-03 (×2): 40 mg via ORAL
  Filled 2019-01-02 (×2): qty 1

## 2019-01-02 NOTE — Progress Notes (Signed)
PROGRESS NOTE  Eugene Christensen PPI:[id number] DOB: 02/24/1966 DOA: 12/22/2018 PCP: [facility]  Brief History   53 y.o.malewith a history of tobacco use, alcohol use, and HTN who presented to the ED with increasing dyspnea that started a few days prior, shortly after he tripped going up stairs at his parents' house, falling backwards with resultant significant left-sided chest pain with breathing. He was found to have increased work of breathing, wheezing, and continued to have hypoxia despite continuous nebs. CXR revealed right 6th rib fracture without significant PTX or infiltrate, subsequent CTA chest was performed showing developing LLL pneumonia and fracture of 6th right rib. . Due to ongoing hypoxia, admission was requested.By admission the patient's O2 requirements had decreased from 6L to 2L. However, on 12/27/2018, the patient had an abrupt increase in oxygen requirements. He was then oxygenating at 94% on 15 L. He had worsening infiltrates on CXR. He was transferred to SDU, and PCCM was consulted. His oxygen requirements have been declining since then. He has been tested for COVID-19. These results are pending and are not expected until 01/01/2019. However, ID has been consulted and based upon the patient's risk profile the patient's isolation profile has been decreased to droplet and contact precautions from airborne.  The patient was transferred to a telemetry bed. An attempt was made to ambulate the patient on room air. He desaturated to 80%.   Consultants  . PCCM, Infectous Disease  Procedures  . Non  Antibiotics      Start        Dose/Rate  Route  Frequency  Ordered  Stop     12/22/18 1345    cefTRIAXone (ROCEPHIN) 1 g in sodium chloride 0.9 % 100 mL IVPB  Status:  Discontinued       1 g  200 mL/hr over 30 Minutes  Intravenous  Every 24 hours  12/22/18 1331  12/22/18 1332     12/22/18 1345    azithromycin (ZITHROMAX) 500 mg in sodium chloride  0.9 % 250 mL IVPB  Status:  Discontinued       500 mg  250 mL/hr over 60 Minutes  Intravenous  Every 24 hours  12/22/18 1331  12/22/18 1332     12/22/18 1245    cefTRIAXone (ROCEPHIN) 2 g in sodium chloride 0.9 % 100 mL IVPB       2 g  200 mL/hr over 30 Minutes  Intravenous  Every 24 hours  12/22/18 1230        12/22/18 1245    azithromycin (ZITHROMAX) 500 mg in sodium chloride 0.9 % 250 mL IVPB  Status:  Discontinued       500 mg  250 mL/hr over 60 Minutes  Intravenous  Every 24 hours  12/22/18 1230  12/30/18 1059     12/22/18 1245    azithromycin (ZITHROMAX) 500 MG injection     Note to Pharmacy:  Gertie Baron   : cabinet override           12/22/18 1245  12/23/18 0059      .   Interval History/Subjective  52 y.o.malewith a history of tobacco use, alcohol use, and HTN who presented to the ED with increasing dyspnea that started a few days prior, shortly after he tripped going up stairs at his parents' house, falling backwards with resultant significant left-sided chest pain with breathing. He was found to have increased work of breathing, wheezing, and continued to have hypoxia despite continuous nebs. CXR revealed right 6th rib  fracture without significant PTX or infiltrate, subsequent CTA chest was performed showing developing LLL pneumonia and fracture of 6th right rib. . Due to ongoing hypoxia, admission was requested.By admission the patient's O2 requirements had decreased from 6L to 2L. However, on 12/27/2018, the patient had an abrupt increase in oxygen requirements. He was then oxygenating at 94% on 15 L. He had worsening infiltrates on CXR. He was transferred to SDU, and PCCM was consulted. His oxygen requirements have been declining since then. He has been tested for COVID-19. These results are pending and are not expected until 01/01/2019. However, ID has been consulted and based upon the patient's risk profile the patient's isolation  profile has been decreased to droplet and contact precautions from airborne.  The patient is sitting up in bed upon my visit. He is in no acute distress. He has been transferred out of the ICU. An attempt was made to ambulate the patient on room air. He desaturated badly to 80% on room air with ambulation. He is in good spirits.  Objective   Vitals:  Vitals:   01/02/19 0830 01/02/19 1345  BP:  119/74  Pulse:  66  Resp:  16  Temp:  97.6 F (36.4 C)  SpO2: 93% 100%    Exam:  Constitutional:  . Appears calm and comfortable Respiratory:  . CTA bilaterally, no wheezes, rales, or rhonci. . No tactile fremitus. . No increased work of breathing. Cardiovascular:  . RRR, no murmurs, ectopy, or gallups. . No lateral PMI. No thrills. . Normal pedal pulses . No edema. Abdomen:  . Abdomen non-tender, non-distended . Normoactive bowel sounds. . No hernias, masses, or organomegaly are appreciated Musculoskeletal:  . No cyanosis, clubbing, or edema Skin:  . No rashes, lesions, ulcers . palpation of skin: no induration or nodules Neurologic:  . CN 2-12 intact . Sensation all 4 extremities intact Psychiatric:  . Mental status o Mood, affect appropriate o Orientation to person, place, time  . judgment and insight appear intact     I have personally reviewed the following:   Today's Data  . BMP, Vitals  Imaging  . LLL pneumonia with mild increasing in left lung opacity with lower lobe mucoid impaction on recent CT chest.  Cardiology Data  . Echo: Normal EF. Normal diastolic function  Scheduled Meds: . alfuzosin  10 mg Oral Daily  . calcium carbonate  1 tablet Oral Q breakfast  . Chlorhexidine Gluconate Cloth  6 each Topical Daily  . finasteride  5 mg Oral Daily  . folic acid  1 mg Oral Daily  . furosemide  40 mg Oral BID  . guaiFENesin  600 mg Oral BID  . mouth rinse  15 mL Mouth Rinse BID  . multivitamin with minerals  1 tablet Oral Daily  . nicotine  21 mg  Transdermal Daily  . pantoprazole  40 mg Oral Daily  . polyethylene glycol  17 g Oral Daily  . potassium chloride  40 mEq Oral Once  . sertraline  50 mg Oral Daily  . sodium chloride  1 g Oral BID WC  . thiamine  100 mg Oral Daily   Or  . thiamine  100 mg Intravenous Daily  . traZODone  50 mg Oral QHS  . umeclidinium bromide  1 puff Inhalation Daily   Continuous Infusions:  Active Problems:   Community acquired pneumonia of left lower lobe of lung (HCC)   Multiple rib fractures   COPD with acute exacerbation (HCC)   Essential  hypertension   ARDS (adult respiratory distress syndrome) (HCC)   Acute respiratory failure with hypoxia (HCC)   A & P   Acute hypoxic respiratory failure: Improving slowly. The patient continues to desaturate to the low to mid 80's on room air. No prior need for O2. Continue to try to wean.   ARDS: Per PCCM. Their assistance is greatly appreciated. Improving with diuresis.   Hyponatremia: 129 today. Lasix decreased. Follow chemistry. Possibly overdiuresis.  LLL Pneumonia: The patient has completed 8 days of azithromycin and 2 days of Rocephin. Infectious disease is following. CXR is improving.  COPD exacerbation: Mucinex, continue bronchodilator therapy and Incruse ellipta. COVOD-19 testing is negative.  Tobacco use: Tobacco cessation counseling given. On nicotine patch.  Right 6th rib fracture: Incentive spirometry. Contributing to development of pneumonia. Oxicodone IR for pain control.  Essential hypertension: Blood pressures are well controlled currently on proscar and lasix. Have changed lasix to PO. Monitor.  I have seen and examined this patient myself. I have spent 30 minutes in his evaluation and care.  DVT prophylaxis: Lovenox Code Status: Full code Family Communication: No family at bedside Disposition Plan: tbd  Leander Tout, DO [facility] Direct contact: see [email or web address]  7PM-7AM contact night coverage as above 01/01/2019,  2:09 PM   LOS: 10 days

## 2019-01-02 NOTE — Plan of Care (Signed)
Plan of care reviewed and discussed with the patient. 

## 2019-01-02 NOTE — Progress Notes (Signed)
VAST RN completing PIV audits. Pt's IV leaking when flushed. IV dc'd per protocol.  VAST RN spoke with pt's nurse, Amy and requested she speak with physician about changing pt's Lasix to PO dose as it is only IV med or fluid ordered at this time. Advised pt requested IVT place IV if another one is needed and to place an IVT consult if necessary. She verbalized understanding.

## 2019-01-02 NOTE — Progress Notes (Signed)
OT Cancellation Note  Patient Details Name: Eugene Christensen MRN: [id number] DOB: 17-Feb-1966   Cancelled Treatment:    Reason Eval/Treat Not Completed: OT screened, no needs identified, will sign off. Pt is moving well with PT, and he was seen by me on last admission.  Checked in with him; no OT needs at this time  [facility] 01/02/2019, 1:52 PM  Marica Otter, OTR/L Acute Rehabilitation Services [phone] [facility] pager [phone] office 01/02/2019

## 2019-01-03 DIAGNOSIS — R069 Unspecified abnormalities of breathing: Secondary | ICD-10-CM

## 2019-01-03 LAB — BASIC METABOLIC PANEL
Anion gap: 8 (ref 5–15)
BUN: 10 mg/dL (ref 6–20)
CO2: 30 mmol/L (ref 22–32)
Calcium: 8.6 mg/dL — ABNORMAL LOW (ref 8.9–10.3)
Chloride: 94 mmol/L — ABNORMAL LOW (ref 98–111)
Creatinine, Ser: 0.62 mg/dL (ref 0.61–1.24)
GFR calc Af Amer: >60 mL/min (ref >60)
GFR calc non Af Amer: >60 mL/min (ref >60)
Glucose, Bld: 88 mg/dL (ref 70–99)
Potassium: 3.4 mmol/L — ABNORMAL LOW (ref 3.5–5.1)
SODIUM: 132 mmol/L — AB (ref 135–145)

## 2019-01-03 MED ORDER — FOLIC ACID 1 MG PO TABS: 1.0000 mg | ORAL_TABLET | Freq: Every day | ORAL | 0 refills | Status: AC

## 2019-01-03 MED ORDER — ADULT MULTIVITAMIN W/MINERALS CH: 1.0000 | ORAL_TABLET | Freq: Every day | ORAL | 0 refills | Status: AC

## 2019-01-03 MED ORDER — FUROSEMIDE 20 MG PO TABS: 20.0000 mg | ORAL_TABLET | Freq: Every day | ORAL | 0 refills | Status: AC

## 2019-01-03 MED ORDER — OXYCODONE HCL 5 MG PO TABS: 5.0000 mg | ORAL_TABLET | ORAL | 0 refills | Status: AC | PRN

## 2019-01-03 MED ORDER — POLYETHYLENE GLYCOL 3350 17 G PO PACK: 17.0000 g | PACK | Freq: Every day | ORAL | 0 refills | Status: AC

## 2019-01-03 MED ORDER — NICOTINE 21 MG/24HR TD PT24: 21.0000 mg | MEDICATED_PATCH | Freq: Every day | TRANSDERMAL | 0 refills | Status: AC

## 2019-01-03 NOTE — Discharge Summary (Signed)
Physician Discharge Summary  Eugene Christensen KMQ:[id number] DOB: 06/25/1966 DOA: 12/22/2018  PCP: [facility]  Admit date: 12/22/2018 Discharge date: 2019-01-31  Recommendations for Outpatient Follow-up:  1. Pt urged to strictly observe social distancing, avoid crowded spaces and sick individuals. He is to follow up with his PCP in 7-10 days. He should have a chemistry drawn on that day.  Follow-up Information    [facility] Follow up.   Why:  home oxygen Contact information: [address] [place] [address] [phone]        [facility].   Why:  please contact your Primary Care Physician to arrange follow up visit in 1 week Contact information: [address] [facility] [place] [address] [phone]          Discharge Diagnoses: Principal diagnosis is #1 1. Acute hypoxic respiratory failure. 2. COPD Exacerbation 3. ARDS 4. LLL Pneumonia 5. Right 6th rib fracture 6. Essential hypertension  Discharge Condition: Fair Disposition: Home  Diet recommendation: Hear healthy  Filed Weights   12/22/18 1600 12/27/18 1040  Weight: 68 kg 62.4 kg    History of present illness:  The patient is a 53 yr old man who fell while going up his stairs at home last Friday. He states that when he fell he immediately had pain on the left side of his chest. He has broken ribs before, and recognizd it. He states that he started feeling sick Saturday morning. He has had cough, shortness of breath and fevers.   The patient has a past medical history significant for hypertension and COPD.   Hospital Course:  53 y.o.malewith a history of tobacco use, alcohol use, and HTN who presented to the ED with increasing dyspnea that started a few days prior, shortly after he tripped going up stairs at his parents' house, falling backwards with resultant significant left-sided chest pain with breathing. He was found to have increased work of  breathing, wheezing, and continued to have hypoxia despite continuous nebs. CXR revealed right 6th rib fracture without significant PTX or infiltrate, subsequent CTA chest was performed showing developing LLL pneumonia and fracture of 6th right rib. . Due to ongoing hypoxia, admission was requested.By admission the patient's O2 requirements had decreased from 6L to 2L. However, on 12/27/2018, the patient had an abrupt increase in oxygen requirements. He was then oxygenating at 94% on 15 L. He had worsening infiltrates on CXR. He was transferred to SDU, and PCCM was consulted. His oxygen requirements have been declining since then. He has been tested for COVID-19. These results are pending and are not expected until 01/01/2019. However, ID has been consulted and based upon the patient's risk profile the patient's isolation profile has been decreased to droplet and contact precautions from airborne.  The patient was transferred to a telemetry bed. An attempt was made to ambulate the patient on room air. He desaturated to 80%.  Today's assessment: S: The patient is awake, alert, and oriented x 3. No acute distress. O: Vitals:  Vitals:   31-Jan-2019 0907 January 31, 2019 1304  BP:  122/81  Pulse:  81  Resp:  16  Temp:  98.7 F (37.1 C)  SpO2: 96% 90%    Constitutional:  . The patient is awake, alert, and oriented x 3. No acute distress. Respiratory:  . No wheezes, rales, or rhonchi. . No tactile fremitus. . No increased work of breathing. No wheezes, rales, or rhonchi. No tactile fremitus. Cardiovascular:  . Regular  rate and rhythm. No murmurs, ectopy, or gallups are appreciated. . No lateral PMI. No thrills. . Normal pedal pulses Abdomen:  . Abdomen was soft, non-tender, non-distended. . No hernias, masses, or organomegaly are appreciated. . Normoactive bowel sounds.  Musculoskeletal:  . No cyanosis, clubbing or edema Skin:  . No rashes, lesions, ulcers . palpation of skin: no induration or  nodules Neurologic:  . CN 2-12 intact . The patient was moving all extremities. Psychiatric:  . judgement and insight appear normal . Mental status o Mood, affect appropriate o Orientation to person, place, time   Discharge Instructions  Discharge Instructions    Activity as tolerated - No restrictions   Complete by:  As directed    Call MD for:  difficulty breathing, headache or visual disturbances   Complete by:  As directed    Call MD for:  temperature >100.4   Complete by:  As directed    Diet - low sodium heart healthy   Complete by:  As directed    Discharge instructions   Complete by:  As directed    Activity as tolerated. Avoid public places with a lot of people. Maintain a distance of 3-6 feet from people at all times. Stay at home as much as possible. Avoid others who are ill. Follow up with PCP in 7-10 days. Call for appointment. Will need to have a chemistry drawn at that appointment.   Increase activity slowly   Complete by:  As directed      Allergies as of 01-04-19   No Known Allergies     Medication List    STOP taking these medications   meloxicam 15 MG tablet Commonly known as:  MOBIC     TAKE these medications   acetaminophen 500 MG tablet Commonly known as:  TYLENOL Take 500 mg by mouth daily as needed for moderate pain.   alfuzosin 10 MG 24 hr tablet Commonly known as:  UROXATRAL Take 10 mg by mouth daily.   CALCIUM PO Take 1 tablet by mouth daily.   finasteride 5 MG tablet Commonly known as:  PROSCAR Take 5 mg by mouth daily.   folic acid 1 MG tablet Commonly known as:  FOLVITE Take 1 tablet (1 mg total) by mouth daily. Start taking on:  January 04, 2019   furosemide 20 MG tablet Commonly known as:  LASIX Take 1 tablet (20 mg total) by mouth daily.   multivitamin with minerals Tabs tablet Take 1 tablet by mouth daily. Start taking on:  January 04, 2019   nicotine 21 mg/24hr patch Commonly known as:  NICODERM CQ - dosed in  mg/24 hours Place 1 patch (21 mg total) onto the skin daily. Start taking on:  January 04, 2019   omeprazole 20 MG capsule Commonly known as:  PRILOSEC Take 20 mg by mouth daily.   oxyCODONE 5 MG immediate release tablet Commonly known as:  Oxy IR/ROXICODONE Take 1-2 tablets (5-10 mg total) by mouth every 4 (four) hours as needed for moderate pain.   polyethylene glycol packet Commonly known as:  MIRALAX / GLYCOLAX Take 17 g by mouth daily. Start taking on:  January 04, 2019   PROAIR HFA IN Inhale 2 puffs into the lungs every 6 (six) hours as needed (SOB/wheezing).   sertraline 50 MG tablet Commonly known as:  ZOLOFT Take 50 mg by mouth daily.   Stiolto Respimat 2.5-2.5 MCG/ACT Aers Generic drug:  Tiotropium Bromide-Olodaterol Inhale 2 puffs into the lungs daily.  thiamine 100 MG tablet Take 100 mg by mouth daily.   traZODone 50 MG tablet Commonly known as:  DESYREL Take 50 mg by mouth at bedtime.            Durable Medical Equipment  (From admission, onward)         Start     Ordered   2019/01/08 1250  DME Oxygen  Once    Comments:  Please provide with light weight POC  Question Answer Comment  Mode or (Route) Nasal cannula   Liters per Minute 2   Frequency Continuous (stationary and portable oxygen unit needed)   Oxygen conserving device Yes   Oxygen delivery system Gas      01-08-2019 1252         No Known Allergies  The results of significant diagnostics from this hospitalization (including imaging, microbiology, ancillary and laboratory) are listed below for reference.    Significant Diagnostic Studies: Ct Angio Chest Pe W And/or Wo Contrast  Result Date: 12/22/2018 CLINICAL DATA:  Shortness of breath PE suspected, high pretest prob EXAM: CT ANGIOGRAPHY CHEST WITH CONTRAST TECHNIQUE: Multidetector CT imaging of the chest was performed using the standard protocol during bolus administration of intravenous contrast. Multiplanar CT image reconstructions  and MIPs were obtained to evaluate the vascular anatomy. CONTRAST:  ISOVUE-370 IOPAMIDOL (ISOVUE-370) INJECTION 76% COMPARISON:  None. FINDINGS: Cardiovascular: Heart size normal. No pericardial effusion. Satisfactory opacification of pulmonary arteries noted, and there is no evidence of pulmonary emboli. Moderate coronary calcifications. Adequate contrast opacification of the thoracic aorta with no evidence of dissection, aneurysm, or stenosis. There is classic 3-vessel brachiocephalic arch anatomy without proximal stenosis. Mild calcified plaque in the arch and descending thoracic aorta. Visualized proximal abdominal aorta mildly atheromatous, otherwise unremarkable. Mediastinum/Nodes: No hilar or mediastinal adenopathy. Lungs/Pleura: No pleural effusion. No pneumothorax. Pulmonary emphysema. Patchy peribronchial airspace opacities medially in the left lower lobe. Upper Abdomen: No acute findings. Musculoskeletal: L1 compression fracture deformity, probably chronic. No acute fracture or worrisome bone lesion. Ununited fracture of the posterolateral aspect right sixth rib. Old healed right eleventh rib fracture. Review of the MIP images confirms the above findings. IMPRESSION: 1. Negative for acute PE or thoracic aortic dissection. 2. Coronary and Aortic Atherosclerosis (ICD10-170.0). 3. Patchy peribronchial airspace opacities medially in the left lower lobe suggesting pneumonia. Electronically Signed   By: Corlis Leak M.D.   On: 12/22/2018 12:20   Dg Chest Port 1 View  Result Date: 12/31/2018 CLINICAL DATA:  Abnormal respiration EXAM: PORTABLE CHEST 1 VIEW COMPARISON:  Two days ago FINDINGS: Interstitial opacity is patchy on the right and more generalized and hazy on the left. Hyperinflation and apical lucency. There is volume loss and more dense opacity in the retrocardiac lung where there was airway impaction by prior CT. IMPRESSION: 1. History of pneumonia with mild increase in left lung opacity. 2.  Emphysema.  Lower lobe mucoid impaction on recent chest CT. Electronically Signed   By: Marnee Spring M.D.   On: 12/31/2018 06:58   Dg Chest Port 1 View  Result Date: 12/29/2018 CLINICAL DATA:  Pulmonary infiltrates. EXAM: PORTABLE CHEST 1 VIEW COMPARISON:  12/27/2018 FINDINGS: The cardiomediastinal silhouette is unchanged with normal heart size. The lungs remain hyperinflated. Interstitial and airspace opacities in the mid and lower lungs bilaterally have slightly improved, most notably in the mid lungs. No sizable pleural effusion or pneumothorax is identified. No acute osseous abnormality is seen. IMPRESSION: Slightly improved bilateral lung opacities compatible with pneumonia. Electronically Signed  By: Sebastian Ache M.D.   On: 12/29/2018 10:18   Dg Chest Port 1 View  Result Date: 12/27/2018 CLINICAL DATA:  Increasing shortness of breath. EXAM: PORTABLE CHEST 1 VIEW COMPARISON:  12/26/2018 and prior radiographs FINDINGS: The cardiomediastinal silhouette is unremarkable. Bilateral interstitial and airspace opacities within the mid and lower lungs unchanged from yesterday. A possible trace LEFT pleural effusion noted. No pneumothorax or acute bony abnormality. IMPRESSION: Unchanged bilateral interstitial and airspace opacities suspicious for pneumonia. Possible trace LEFT pleural effusion. Electronically Signed   By: Harmon Pier M.D.   On: 12/27/2018 09:58   Dg Chest Port 1 View  Result Date: 12/26/2018 CLINICAL DATA:  Hypoxia EXAM: PORTABLE CHEST 1 VIEW COMPARISON:  Chest radiograph and chest CT December 22, 2018 FINDINGS: There is widespread airspace opacity throughout both mid and lower lung zones, most severe in the left base where there is frank consolidation. There is a small left pleural effusion. Heart size and pulmonary vascularity are normal. No adenopathy. There is aortic atherosclerosis. There is a recent appearing fracture of the posterior left fifth rib. No pneumothorax. IMPRESSION:  Widespread apparent pneumonia throughout mid and lower lung zones bilaterally with the greatest degree of consolidation in the left base. Small left pleural effusion. Stable cardiac silhouette. No adenopathy evident. Aortic Atherosclerosis (ICD10-I70.0). Electronically Signed   By: Bretta Bang III M.D.   On: 12/26/2018 17:40   Dg Chest Portable 1 View  Result Date: 12/22/2018 CLINICAL DATA:  Shortness of breath.  Recent fall EXAM: PORTABLE CHEST 1 VIEW COMPARISON:  June 30, 2004 FINDINGS: There is no evident edema or consolidation. Heart size and pulmonary vascularity are normal. No adenopathy. There is aortic atherosclerosis. No pneumothorax. There is an apparent recent fracture of the posterior right sixth rib. There is an older fracture of the posterior right ninth rib. IMPRESSION: Apparent acute/recent fracture of the posterior right sixth rib. No pneumothorax. No edema or consolidation. Heart size normal. Aortic Atherosclerosis (ICD10-I70.0). Electronically Signed   By: Bretta Bang III M.D.   On: 12/22/2018 11:19    Microbiology: Recent Results (from the past 240 hour(s))  MRSA PCR Screening     Status: None   Collection Time: 12/27/18 11:05 AM  Result Value Ref Range Status   MRSA by PCR NEGATIVE NEGATIVE Final    Comment:        The GeneXpert MRSA Assay (FDA approved for NASAL specimens only), is one component of a comprehensive MRSA colonization surveillance program. It is not intended to diagnose MRSA infection nor to guide or monitor treatment for MRSA infections. Performed at [facility], [address] [place], [place] [address]   Culture, blood (routine x 2) Call MD if unable to obtain prior to antibiotics being given     Status: None   Collection Time: 12/27/18 11:40 AM  Result Value Ref Range Status   Specimen Description   Final    BLOOD LEFT ARM Performed at [facility], [address], [place], [place] [address]    Special  Requests   Final    BOTTLES DRAWN AEROBIC ONLY Blood Culture adequate volume Performed at [facility], [address], [place], [place] [address]    Culture   Final    NO GROWTH 5 DAYS Performed at [facility], [address], [place], [place] [address]    Report Status 01/01/2019 FINAL  Final  Culture, blood (routine x 2) Call MD if unable to obtain prior  to antibiotics being given     Status: None   Collection Time: 12/27/18 11:40 AM  Result Value Ref Range Status   Specimen Description   Final    BLOOD RIGHT ARM Performed at [facility], [address], [place], [place] [address]    Special Requests   Final    BOTTLES DRAWN AEROBIC ONLY Blood Culture results may not be optimal due to an inadequate volume of blood received in culture bottles Performed at [facility], [address], [place], [place] [address]    Culture   Final    NO GROWTH 5 DAYS Performed at [facility], [address], [place], [place] [address]    Report Status 01/01/2019 FINAL  Final  Novel Coronavirus, NAA (hospital order; send-out to ref lab)     Status: None   Collection Time: 12/28/18 12:35 PM  Result Value Ref Range Status   SARS-CoV-2, NAA Not Detected Not Detected Final    Comment: (NOTE) Testing was performed using the cobas(R) SARS-CoV-2 test. This test was developed and its performance characteristics determined by [employer]. This test has not been FDA cleared or approved. This test has been authorized by FDA under an Emergency Use Authorization (EUA). This test is only authorized for the duration of time the declaration that circumstances exist justifying the authorization of the emergency use of in vitro diagnostic tests for detection of SARS-CoV-2 virus and/or diagnosis of COVID-19 infection under section 564(b)(1) of the Act, 21 U.S.C. 914NWG-9(F)(6), unless the authorization is terminated or revoked sooner. Performed  At: [facility] [address] [place], [place] [id number] Jolene Schimke MD IO:[id number]    Coronavirus Source NASAL SWAB  Final    Comment: Performed at [facility], [address] [place], [place] [address]     Labs: Basic Metabolic Panel: Recent Labs  Lab 12/29/18 0418 12/30/18 0510 12/31/18 0304 12/31/18 1220 01/02/19 0949 2019/01/31 0317  NA 130* 132* 129* 127* 129* 132*  K 2.9* 3.8 3.8 3.4* 3.0* 3.4*  CL 87* 91* 88* 83* 86* 94*  CO2 28 32 30 31 33* 30  GLUCOSE 91 112* 105* 97 96 88  BUN 16 13 15 13 8 10   CREATININE 0.60* 0.65 0.74 0.63 0.72 0.62  CALCIUM 8.6* 8.4* 8.8* 9.2 8.5* 8.6*  MG 1.8  --  2.0  --   --   --   PHOS  --   --  4.2  --   --   --    Liver Function Tests: Recent Labs  Lab 12/28/18 0332 12/29/18 0418  AST 24 19  ALT 18 16  ALKPHOS 52 51  BILITOT 0.8 1.2  PROT 7.1 6.8  ALBUMIN 3.3* 3.1*   No results for input(s): LIPASE, AMYLASE in the last 168 hours. No results for input(s): AMMONIA in the last 168 hours. CBC: Recent Labs  Lab 12/28/18 0332 12/29/18 0418 12/30/18 0510 12/31/18 0304  WBC 14.6* 13.5* 12.6* 15.3*  NEUTROABS 9.6*  --   --  9.8*  HGB 14.8 14.3 13.8 14.4  HCT 45.9 42.1 40.6 42.8  MCV 99.4 95.9 96.9 96.8  PLT 265 294 322 353   Cardiac Enzymes: No results for input(s): CKTOTAL, CKMB, CKMBINDEX, TROPONINI in the last 168 hours. BNP: BNP (last 3 results) Recent Labs    12/22/18 1052 12/28/18 0332  BNP 135.2* 74.8    ProBNP (last 3 results) No results for input(s): PROBNP in the last 8760 hours.  CBG: No results for input(s): GLUCAP in the last 168 hours.  Active Problems:   Community acquired pneumonia of left lower lobe of lung (HCC)   Multiple rib fractures   COPD with acute exacerbation (HCC)   Essential hypertension   ARDS (adult respiratory distress syndrome) (HCC)   Acute respiratory failure with hypoxia (HCC)   Time coordinating discharge: 38 minutes.  Signed:         Shasha Buchbinder, DO [facility]  Jan 10, 2019, 2:20 PM

## 2019-01-03 NOTE — Discharge Instructions (Signed)
Community-Acquired Pneumonia, Adult °Pneumonia is an infection of the lungs. There are different types of pneumonia. One type can develop while a person is in a hospital. A different type, called community-acquired pneumonia, develops in people who are not, or have not recently been, in the hospital or other health care facility. °What are the causes? ° °Pneumonia may be caused by bacteria, viruses, or funguses. Community-acquired pneumonia is often caused by Streptococcus pneumonia bacteria. These bacteria are often passed from one person to another by breathing in droplets from the cough or sneeze of an infected person. °What increases the risk? °The condition is more likely to develop in: °· People who have chronic diseases, such as chronic obstructive pulmonary disease (COPD), asthma, congestive heart failure, cystic fibrosis, diabetes, or kidney disease. °· People who have early-stage or late-stage HIV. °· People who have sickle cell disease. °· People who have had their spleen removed (splenectomy). °· People who have poor dental hygiene. °· People who have medical conditions that increase the risk of breathing in (aspirating) secretions their own mouth and nose. °· People who have a weakened immune system (immunocompromised). °· People who smoke. °· People who travel to areas where pneumonia-causing germs commonly exist. °· People who are around animal habitats or animals that have pneumonia-causing germs, including birds, bats, rabbits, cats, and farm animals. °What are the signs or symptoms? °Symptoms of this condition include: °· A dry cough. °· A wet (productive) cough. °· Fever. °· Sweating. °· Chest pain, especially when breathing deeply or coughing. °· Rapid breathing or difficulty breathing. °· Shortness of breath. °· Shaking chills. °· Fatigue. °· Muscle aches. °How is this diagnosed? °Your health care provider will take a medical history and perform a physical exam. You may also have other tests,  including: °· Imaging studies of your chest, including X-rays. °· Tests to check your blood oxygen level and other blood gases. °· Other tests on blood, mucus (sputum), fluid around your lungs (pleural fluid), and urine. °If your pneumonia is severe, other tests may be done to identify the specific cause of your illness. °How is this treated? °The type of treatment that you receive depends on many factors, such as the cause of your pneumonia, the medicines you take, and other medical conditions that you have. For most adults, treatment and recovery from pneumonia may occur at home. In some cases, treatment must happen in a hospital. Treatment may include: °· Antibiotic medicines, if the pneumonia was caused by bacteria. °· Antiviral medicines, if the pneumonia was caused by a virus. °· Medicines that are given by mouth or through an IV tube. °· Oxygen. °· Respiratory therapy. °Although rare, treating severe pneumonia may include: °· Mechanical ventilation. This is done if you are not breathing well on your own and you cannot maintain a safe blood oxygen level. °· Thoracentesis. This procedure removes fluid around one lung or both lungs to help you breathe better. °Follow these instructions at home: ° °· Take over-the-counter and prescription medicines only as told by your health care provider. °? Only take cough medicine if you are losing sleep. Understand that cough medicine can prevent your body’s natural ability to remove mucus from your lungs. °? If you were prescribed an antibiotic medicine, take it as told by your health care provider. Do not stop taking the antibiotic even if you start to feel better. °· Sleep in a semi-upright position at night. Try sleeping in a reclining chair, or place a few pillows under your head. °· Do not use tobacco products, including cigarettes, chewing tobacco, and e-cigarettes.   If you need help quitting, ask your health care provider. °· Drink enough water to keep your urine  clear or pale yellow. This will help to thin out mucus secretions in your lungs. °How is this prevented? °There are ways that you can decrease your risk of developing community-acquired pneumonia. Consider getting a pneumococcal vaccine if: °· You are older than 54 years of age. °· You are older than 54 years of age and are undergoing cancer treatment, have chronic lung disease, or have other medical conditions that affect your immune system. Ask your health care provider if this applies to you. °There are different types and schedules of pneumococcal vaccines. Ask your health care provider which vaccination option is best for you. °You may also prevent community-acquired pneumonia if you take these actions: °· Get an influenza vaccine every year. Ask your health care provider which type of influenza vaccine is best for you. °· Go to the dentist on a regular basis. °· Wash your hands often. Use hand sanitizer if soap and water are not available. °Contact a health care provider if: °· You have a fever. °· You are losing sleep because you cannot control your cough with cough medicine. °Get help right away if: °· You have worsening shortness of breath. °· You have increased chest pain. °· Your sickness becomes worse, especially if you are an older adult or have a weakened immune system. °· You cough up blood. °This information is not intended to replace advice given to you by your health care provider. Make sure you discuss any questions you have with your health care provider. °Document Released: 10/01/2005 Document Revised: 06/20/2017 Document Reviewed: 01/26/2015 °Elsevier Interactive Patient Education © 2019 [employer] Inc[employer] ° °

## 2019-01-03 NOTE — Progress Notes (Signed)
Patient discharging home.  Reviewed AVS and medications.  Instructed to follow up with PCP in 7-10 days. Verbalizes understanding, no questions at this time. O2 tank delievered by [facility] - mother here for pick up.  Assisted off unit in NAD with belongings via [facility]

## 2019-01-03 NOTE — Progress Notes (Signed)
Patients O2 sat on RA at rest - 96%.  Attempted to ambulate without O2, but sats dropped to 80% on RA before patient got to door way.  O2 applied, had to be increased to 3L [place] to maintain 88--92%.  Patient having some SOB, but tolerated well.

## 2019-01-03 NOTE — Progress Notes (Signed)
SATURATION QUALIFICATIONS: (This note is used to comply with regulatory documentation for home oxygen)  Patient Saturations on Room Air at Rest = 96%  Patient Saturations on Room Air while Ambulating = 80%  Patient Saturations on 3 Liters of oxygen while Ambulating = 92%  Please briefly explain why patient needs home oxygen: O2 sats decrease to 80% on RA while ambulating, increased to 93% when 3L O2 [place] applied

## 2019-01-03 NOTE — TOC Initial Note (Addendum)
Transition of Care [facility]) - Initial/Assessment Note    Patient Details  Name: Eugene Christensen MRN: [id number] Date of Birth: 1966/01/13  Transition of Care [facility]) CM/SW Contact:    Elliot Cousin, RN Phone Number: 01/11/19, 1:00 PM  Clinical Narrative:                 Spoke to pt and explained oxygen portable will be delivered to room and concentrator to his home.  Contacted [employer] for oxygen for home.    Expected Discharge Plan: Home/Self Care Barriers to Discharge: No Barriers Identified   Patient Goals and CMS Choice        Expected Discharge Plan and Services Expected Discharge Plan: Home/Self Care In-house Referral: NA Discharge Planning Services: CM Consult Post Acute Care Choice: NA Living arrangements for the past 2 months: Single Family Home Expected Discharge Date: 2019-01-11               DME Arranged: Oxygen DME Agency: [facility] HH Arranged: NA HH Agency: NA  Prior Living Arrangements/Services Living arrangements for the past 2 months: Single Family Home Lives with:: Parents Patient language and need for interpreter reviewed:: No Do you feel safe going back to the place where you live?: Yes      Need for Family Participation in Patient Care: No (Comment) Care giver support system in place?: Yes (comment)   Criminal Activity/Legal Involvement Pertinent to Current Situation/Hospitalization: No - Comment as needed  Activities of Daily Living Home Assistive Devices/Equipment: Cane (specify quad or straight), Eyeglasses(single point cane) ADL Screening (condition at time of admission) Patient's cognitive ability adequate to safely complete daily activities?: Yes Is the patient deaf or have difficulty hearing?: No Does the patient have difficulty seeing, even when wearing glasses/contacts?: No Does the patient have difficulty concentrating, remembering, or making decisions?: No Patient able to express need for assistance with ADLs?: Yes Does the patient  have difficulty dressing or bathing?: No Independently performs ADLs?: Yes (appropriate for developmental age) Does the patient have difficulty walking or climbing stairs?: Yes(secondary to shortness of breath and rib pain) Weakness of Legs: Both Weakness of Arms/Hands: None  Permission Sought/Granted Permission sought to share information with : Case Manager, PCP Permission granted to share information with : Yes, Verbal Permission Granted              Emotional Assessment Appearance:: Appears stated age Attitude/Demeanor/Rapport: Engaged Affect (typically observed): Calm, Pleasant Orientation: : Oriented to Self, Oriented to Place, Oriented to  Time, Oriented to Situation   Psych Involvement: No (comment)  Admission diagnosis:  SOB (shortness of breath) [R06.02] Hypoxia [R09.02] Community acquired pneumonia of left lower lobe of lung (HCC) [J18.1] Patient Active Problem List   Diagnosis Date Noted  . ARDS (adult respiratory distress syndrome) (HCC) 12/31/2018  . Acute respiratory failure with hypoxia (HCC) 12/31/2018  . Community acquired pneumonia of left lower lobe of lung (HCC) 12/22/2018  . Multiple rib fractures 12/22/2018  . COPD with acute exacerbation (HCC) 12/22/2018  . Essential hypertension 12/22/2018   PCP:  [facility] Pharmacy:  No Pharmacies Listed    Social Determinants of Health (SDOH) Interventions    Readmission Risk Interventions No flowsheet data found.

## 2019-01-14 NOTE — Death Summary Note (Deleted)
Physician Discharge Summary  Eugene Christensen ZOX:[id number] DOB: [birth year] DOA: 12/22/2018  PCP: [facility]  Admit date: 12/22/2018 Discharge date: 01-24-19  Recommendations for Outpatient Follow-up:  1. Pt urged to strictly observe social distancing, avoid crowded spaces and sick individuals. He is to follow up with his PCP in 7-10 days. He should have a chemistry drawn on that day.   Discharge Diagnoses: Principal diagnosis is #1 1. Acute hypoxic respiratory failure. 2. COPD Exacerbation 3. ARDS 4. LLL Pneumonia 5. Right 6th rib fracture 6. Essential hypertension  Discharge Condition: Fair Disposition: Home  Diet recommendation: Hear healthy  Filed Weights   12/22/18 1600 12/27/18 1040  Weight: 68 kg 62.4 kg    History of present illness:  The patient is a 53 yr old man who fell while going up his stairs at home last Friday. He states that when he fell he immediately had pain on the left side of his chest. He has broken ribs before, and recognizd it. He states that he started feeling sick Saturday morning. He has had cough, shortness of breath and fevers.   The patient has a past medical history significant for hypertension and COPD.   Hospital Course:  53 y.o.malewith a history of tobacco use, alcohol use, and HTN who presented to the ED with increasing dyspnea that started a few days prior, shortly after he tripped going up stairs at his parents' house, falling backwards with resultant significant left-sided chest pain with breathing. He was found to have increased work of breathing, wheezing, and continued to have hypoxia despite continuous nebs. CXR revealed right 6th rib fracture without significant PTX or infiltrate, subsequent CTA chest was performed showing developing LLL pneumonia and fracture of 6th right rib. . Due to ongoing hypoxia, admission was requested.By admission the patient's O2 requirements had decreased from 6L to 2L. However, on 12/27/2018, the  patient had an abrupt increase in oxygen requirements. He was then oxygenating at 94% on 15 L. He had worsening infiltrates on CXR. He was transferred to SDU, and PCCM was consulted. His oxygen requirements have been declining since then. He has been tested for COVID-19. These results are pending and are not expected until 01/01/2019. However, ID has been consulted and based upon the patient's risk profile the patient's isolation profile has been decreased to droplet and contact precautions from airborne.  The patient was transferred to a telemetry bed. An attempt was made to ambulate the patient on room air. He desaturated to 80%.  Today's assessment: S: The patient is awake, alert, and oriented x 3. No acute distress. O: Vitals:  Vitals:   24-Jan-2019 0841 Jan 24, 2019 0907  BP:    Pulse:    Resp:    Temp:    SpO2: 94% 96%    Constitutional:   The patient is awake, alert, and oriented x 3. No acute distress. Respiratory:   No wheezes, rales, or rhonchi.  No tactile fremitus.  No increased work of breathing. No wheezes, rales, or rhonchi. No tactile fremitus. Cardiovascular:   Regular rate and rhythm. No murmurs, ectopy, or gallups are appreciated.  No lateral PMI. No thrills.  Normal pedal pulses Abdomen:   Abdomen was soft, non-tender, non-distended.  No hernias, masses, or organomegaly are appreciated.  Normoactive bowel sounds.  Musculoskeletal:   No cyanosis, clubbing or edema Skin:   No rashes, lesions, ulcers  palpation of skin: no induration or nodules Neurologic:   CN 2-12 intact  The patient was moving all extremities. Psychiatric:  judgement and insight appear normal  Mental status o Mood, affect appropriate o Orientation to person, place, time   Discharge Instructions  Discharge Instructions    Activity as tolerated - No restrictions   Complete by:  As directed    Call MD for:  difficulty breathing, headache or visual disturbances   Complete  by:  As directed    Call MD for:  temperature >100.4   Complete by:  As directed    Diet - low sodium heart healthy   Complete by:  As directed    Discharge instructions   Complete by:  As directed    Activity as tolerated. Avoid public places with a lot of people. Maintain a distance of 3-6 feet from people at all times. Stay at home as much as possible. Avoid others who are ill. Follow up with PCP in 7-10 days. Call for appointment. Will need to have a chemistry drawn at that appointment.   Increase activity slowly   Complete by:  As directed      Allergies as of 2019-01-09   No Known Allergies     Medication List    STOP taking these medications   meloxicam 15 MG tablet Commonly known as:  MOBIC     TAKE these medications   acetaminophen 500 MG tablet Commonly known as:  TYLENOL Take 500 mg by mouth daily as needed for moderate pain.   alfuzosin 10 MG 24 hr tablet Commonly known as:  UROXATRAL Take 10 mg by mouth daily.   CALCIUM PO Take 1 tablet by mouth daily.   finasteride 5 MG tablet Commonly known as:  PROSCAR Take 5 mg by mouth daily.   folic acid 1 MG tablet Commonly known as:  FOLVITE Take 1 tablet (1 mg total) by mouth daily. Start taking on:  January 04, 2019   furosemide 20 MG tablet Commonly known as:  LASIX Take 1 tablet (20 mg total) by mouth daily.   multivitamin with minerals Tabs tablet Take 1 tablet by mouth daily. Start taking on:  January 04, 2019   nicotine 21 mg/24hr patch Commonly known as:  NICODERM CQ - dosed in mg/24 hours Place 1 patch (21 mg total) onto the skin daily. Start taking on:  January 04, 2019   omeprazole 20 MG capsule Commonly known as:  PRILOSEC Take 20 mg by mouth daily.   oxyCODONE 5 MG immediate release tablet Commonly known as:  Oxy IR/ROXICODONE Take 1-2 tablets (5-10 mg total) by mouth every 4 (four) hours as needed for moderate pain.   polyethylene glycol packet Commonly known as:  MIRALAX /  GLYCOLAX Take 17 g by mouth daily. Start taking on:  January 04, 2019   PROAIR HFA IN Inhale 2 puffs into the lungs every 6 (six) hours as needed (SOB/wheezing).   sertraline 50 MG tablet Commonly known as:  ZOLOFT Take 50 mg by mouth daily.   Stiolto Respimat 2.5-2.5 MCG/ACT Aers Generic drug:  Tiotropium Bromide-Olodaterol Inhale 2 puffs into the lungs daily.   thiamine 100 MG tablet Take 100 mg by mouth daily.   traZODone 50 MG tablet Commonly known as:  DESYREL Take 50 mg by mouth at bedtime.            Durable Medical Equipment  (From admission, onward)         Start     Ordered   09-Jan-2019 1250  DME Oxygen  Once    Comments:  Please provide with light weight POC  Question Answer Comment  Mode or (Route) Nasal cannula   Liters per Minute 2   Frequency Continuous (stationary and portable oxygen unit needed)   Oxygen conserving device Yes   Oxygen delivery system Gas      2019/01/19 1252         No Known Allergies  The results of significant diagnostics from this hospitalization (including imaging, microbiology, ancillary and laboratory) are listed below for reference.    Significant Diagnostic Studies: Ct Angio Chest Pe W And/or Wo Contrast  Result Date: 12/22/2018 CLINICAL DATA:  Shortness of breath PE suspected, high pretest prob EXAM: CT ANGIOGRAPHY CHEST WITH CONTRAST TECHNIQUE: Multidetector CT imaging of the chest was performed using the standard protocol during bolus administration of intravenous contrast. Multiplanar CT image reconstructions and MIPs were obtained to evaluate the vascular anatomy. CONTRAST:  ISOVUE-370 IOPAMIDOL (ISOVUE-370) INJECTION 76% COMPARISON:  None. FINDINGS: Cardiovascular: Heart size normal. No pericardial effusion. Satisfactory opacification of pulmonary arteries noted, and there is no evidence of pulmonary emboli. Moderate coronary calcifications. Adequate contrast opacification of the thoracic aorta with no evidence of  dissection, aneurysm, or stenosis. There is classic 3-vessel brachiocephalic arch anatomy without proximal stenosis. Mild calcified plaque in the arch and descending thoracic aorta. Visualized proximal abdominal aorta mildly atheromatous, otherwise unremarkable. Mediastinum/Nodes: No hilar or mediastinal adenopathy. Lungs/Pleura: No pleural effusion. No pneumothorax. Pulmonary emphysema. Patchy peribronchial airspace opacities medially in the left lower lobe. Upper Abdomen: No acute findings. Musculoskeletal: L1 compression fracture deformity, probably chronic. No acute fracture or worrisome bone lesion. Ununited fracture of the posterolateral aspect right sixth rib. Old healed right eleventh rib fracture. Review of the MIP images confirms the above findings. IMPRESSION: 1. Negative for acute PE or thoracic aortic dissection. 2. Coronary and Aortic Atherosclerosis (ICD10-170.0). 3. Patchy peribronchial airspace opacities medially in the left lower lobe suggesting pneumonia. Electronically Signed   By: Corlis Leak M.D.   On: 12/22/2018 12:20   Dg Chest Port 1 View  Result Date: 12/31/2018 CLINICAL DATA:  Abnormal respiration EXAM: PORTABLE CHEST 1 VIEW COMPARISON:  Two days ago FINDINGS: Interstitial opacity is patchy on the right and more generalized and hazy on the left. Hyperinflation and apical lucency. There is volume loss and more dense opacity in the retrocardiac lung where there was airway impaction by prior CT. IMPRESSION: 1. History of pneumonia with mild increase in left lung opacity. 2. Emphysema.  Lower lobe mucoid impaction on recent chest CT. Electronically Signed   By: Marnee Spring M.D.   On: 12/31/2018 06:58   Dg Chest Port 1 View  Result Date: 12/29/2018 CLINICAL DATA:  Pulmonary infiltrates. EXAM: PORTABLE CHEST 1 VIEW COMPARISON:  12/27/2018 FINDINGS: The cardiomediastinal silhouette is unchanged with normal heart size. The lungs remain hyperinflated. Interstitial and airspace opacities  in the mid and lower lungs bilaterally have slightly improved, most notably in the mid lungs. No sizable pleural effusion or pneumothorax is identified. No acute osseous abnormality is seen. IMPRESSION: Slightly improved bilateral lung opacities compatible with pneumonia. Electronically Signed   By: Sebastian Ache M.D.   On: 12/29/2018 10:18   Dg Chest Port 1 View  Result Date: 12/27/2018 CLINICAL DATA:  Increasing shortness of breath. EXAM: PORTABLE CHEST 1 VIEW COMPARISON:  12/26/2018 and prior radiographs FINDINGS: The cardiomediastinal silhouette is unremarkable. Bilateral interstitial and airspace opacities within the mid and lower lungs unchanged from yesterday. A possible trace LEFT pleural effusion noted. No pneumothorax or acute bony abnormality. IMPRESSION: Unchanged bilateral interstitial and airspace opacities  suspicious for pneumonia. Possible trace LEFT pleural effusion. Electronically Signed   By: Harmon Pier M.D.   On: 12/27/2018 09:58   Dg Chest Port 1 View  Result Date: 12/26/2018 CLINICAL DATA:  Hypoxia EXAM: PORTABLE CHEST 1 VIEW COMPARISON:  Chest radiograph and chest CT December 22, 2018 FINDINGS: There is widespread airspace opacity throughout both mid and lower lung zones, most severe in the left base where there is frank consolidation. There is a small left pleural effusion. Heart size and pulmonary vascularity are normal. No adenopathy. There is aortic atherosclerosis. There is a recent appearing fracture of the posterior left fifth rib. No pneumothorax. IMPRESSION: Widespread apparent pneumonia throughout mid and lower lung zones bilaterally with the greatest degree of consolidation in the left base. Small left pleural effusion. Stable cardiac silhouette. No adenopathy evident. Aortic Atherosclerosis (ICD10-I70.0). Electronically Signed   By: Bretta Bang III M.D.   On: 12/26/2018 17:40   Dg Chest Portable 1 View  Result Date: 12/22/2018 CLINICAL DATA:  Shortness of breath.   Recent fall EXAM: PORTABLE CHEST 1 VIEW COMPARISON:  June 30, 2004 FINDINGS: There is no evident edema or consolidation. Heart size and pulmonary vascularity are normal. No adenopathy. There is aortic atherosclerosis. No pneumothorax. There is an apparent recent fracture of the posterior right sixth rib. There is an older fracture of the posterior right ninth rib. IMPRESSION: Apparent acute/recent fracture of the posterior right sixth rib. No pneumothorax. No edema or consolidation. Heart size normal. Aortic Atherosclerosis (ICD10-I70.0). Electronically Signed   By: Bretta Bang III M.D.   On: 12/22/2018 11:19    Microbiology: Recent Results (from the past 240 hour(s))  MRSA PCR Screening     Status: None   Collection Time: 12/27/18 11:05 AM  Result Value Ref Range Status   MRSA by PCR NEGATIVE NEGATIVE Final    Comment:        The GeneXpert MRSA Assay (FDA approved for NASAL specimens only), is one component of a comprehensive MRSA colonization surveillance program. It is not intended to diagnose MRSA infection nor to guide or monitor treatment for MRSA infections. Performed at [facility], [address], [place], [place] [address]   Culture, blood (routine x 2) Call MD if unable to obtain prior to antibiotics being given     Status: None   Collection Time: 12/27/18 11:40 AM  Result Value Ref Range Status   Specimen Description   Final    BLOOD LEFT ARM Performed at [facility], [address] [place], [place] [address]    Special Requests   Final    BOTTLES DRAWN AEROBIC ONLY Blood Culture adequate volume Performed at [facility], [address] [place], [place] [address]    Culture   Final    NO GROWTH 5 DAYS Performed at [facility], [address], [place], [place] [address]    Report Status 01/01/2019 FINAL  Final  Culture, blood (routine x 2) Call MD if unable to obtain prior to antibiotics being given      Status: None   Collection Time: 12/27/18 11:40 AM  Result Value Ref Range Status   Specimen Description   Final    BLOOD RIGHT ARM Performed at [facility], [address], [place], [place] [address]    Special Requests   Final    BOTTLES DRAWN AEROBIC ONLY Blood Culture results may not be optimal due to an inadequate volume of blood received  in culture bottles Performed at [facility], [address], [place], [place] [address]    Culture   Final    NO GROWTH 5 DAYS Performed at [facility], [address] [place], [place] [address]    Report Status 01/01/2019 FINAL  Final  Novel Coronavirus, NAA (hospital order; send-out to ref lab)     Status: None   Collection Time: 12/28/18 12:35 PM  Result Value Ref Range Status   SARS-CoV-2, NAA Not Detected Not Detected Final    Comment: (NOTE) Testing was performed using the cobas(R) SARS-CoV-2 test. This test was developed and its performance characteristics determined by [employer]. This test has not been FDA cleared or approved. This test has been authorized by FDA under an Emergency Use Authorization (EUA). This test is only authorized for the duration of time the declaration that circumstances exist justifying the authorization of the emergency use of in vitro diagnostic tests for detection of SARS-CoV-2 virus and/or diagnosis of COVID-19 infection under section 564(b)(1) of the Act, 21 U.S.C. 226JFH-5(K)(5), unless the authorization is terminated or revoked sooner. Performed At: [facility] [address] [place], [place] [id number] Jolene Schimke MD DS:[id number]    Coronavirus Source NASAL SWAB  Final    Comment: Performed at [facility], [address] [place], [place] [address]     Labs: Basic Metabolic Panel: Recent Labs  Lab 12/29/18 0418 12/30/18 0510 12/31/18 0304 12/31/18 1220 01/02/19 0949 2019/01/22 0317  NA 130* 132* 129* 127* 129*  132*  K 2.9* 3.8 3.8 3.4* 3.0* 3.4*  CL 87* 91* 88* 83* 86* 94*  CO2 28 32 30 31 33* 30  GLUCOSE 91 112* 105* 97 96 88  BUN 16 13 15 13 8 10   CREATININE 0.60* 0.65 0.74 0.63 0.72 0.62  CALCIUM 8.6* 8.4* 8.8* 9.2 8.5* 8.6*  MG 1.8  --  2.0  --   --   --   PHOS  --   --  4.2  --   --   --    Liver Function Tests: Recent Labs  Lab 12/28/18 0332 12/29/18 0418  AST 24 19  ALT 18 16  ALKPHOS 52 51  BILITOT 0.8 1.2  PROT 7.1 6.8  ALBUMIN 3.3* 3.1*   No results for input(s): LIPASE, AMYLASE in the last 168 hours. No results for input(s): AMMONIA in the last 168 hours. CBC: Recent Labs  Lab 12/28/18 0332 12/29/18 0418 12/30/18 0510 12/31/18 0304  WBC 14.6* 13.5* 12.6* 15.3*  NEUTROABS 9.6*  --   --  9.8*  HGB 14.8 14.3 13.8 14.4  HCT 45.9 42.1 40.6 42.8  MCV 99.4 95.9 96.9 96.8  PLT 265 294 322 353   Cardiac Enzymes: No results for input(s): CKTOTAL, CKMB, CKMBINDEX, TROPONINI in the last 168 hours. BNP: BNP (last 3 results) Recent Labs    12/22/18 1052 12/28/18 0332  BNP 135.2* 74.8    ProBNP (last 3 results) No results for input(s): PROBNP in the last 8760 hours.  CBG: No results for input(s): GLUCAP in the last 168 hours.  Active Problems:   Community acquired pneumonia of left lower lobe of lung (HCC)   Multiple rib fractures   COPD with acute exacerbation (HCC)   Essential hypertension   ARDS (adult respiratory distress syndrome) (HCC)   Acute respiratory failure with hypoxia (HCC)   Time coordinating discharge: 38 minutes.  Signed:        Yareli Carthen, DO [facility]  07-05-2019, 12:57 PM

## 2019-01-14 DEATH — deceased

## 2020-10-05 IMAGING — DX PORTABLE CHEST - 1 VIEW
2 series · 2 of 2 positions shown · non-contrast
Comparison: Chest radiograph and chest CT December 22, 2018

CLINICAL DATA: Hypoxia

EXAM:
PORTABLE CHEST 1 VIEW

[chest ap (1 of 2)]
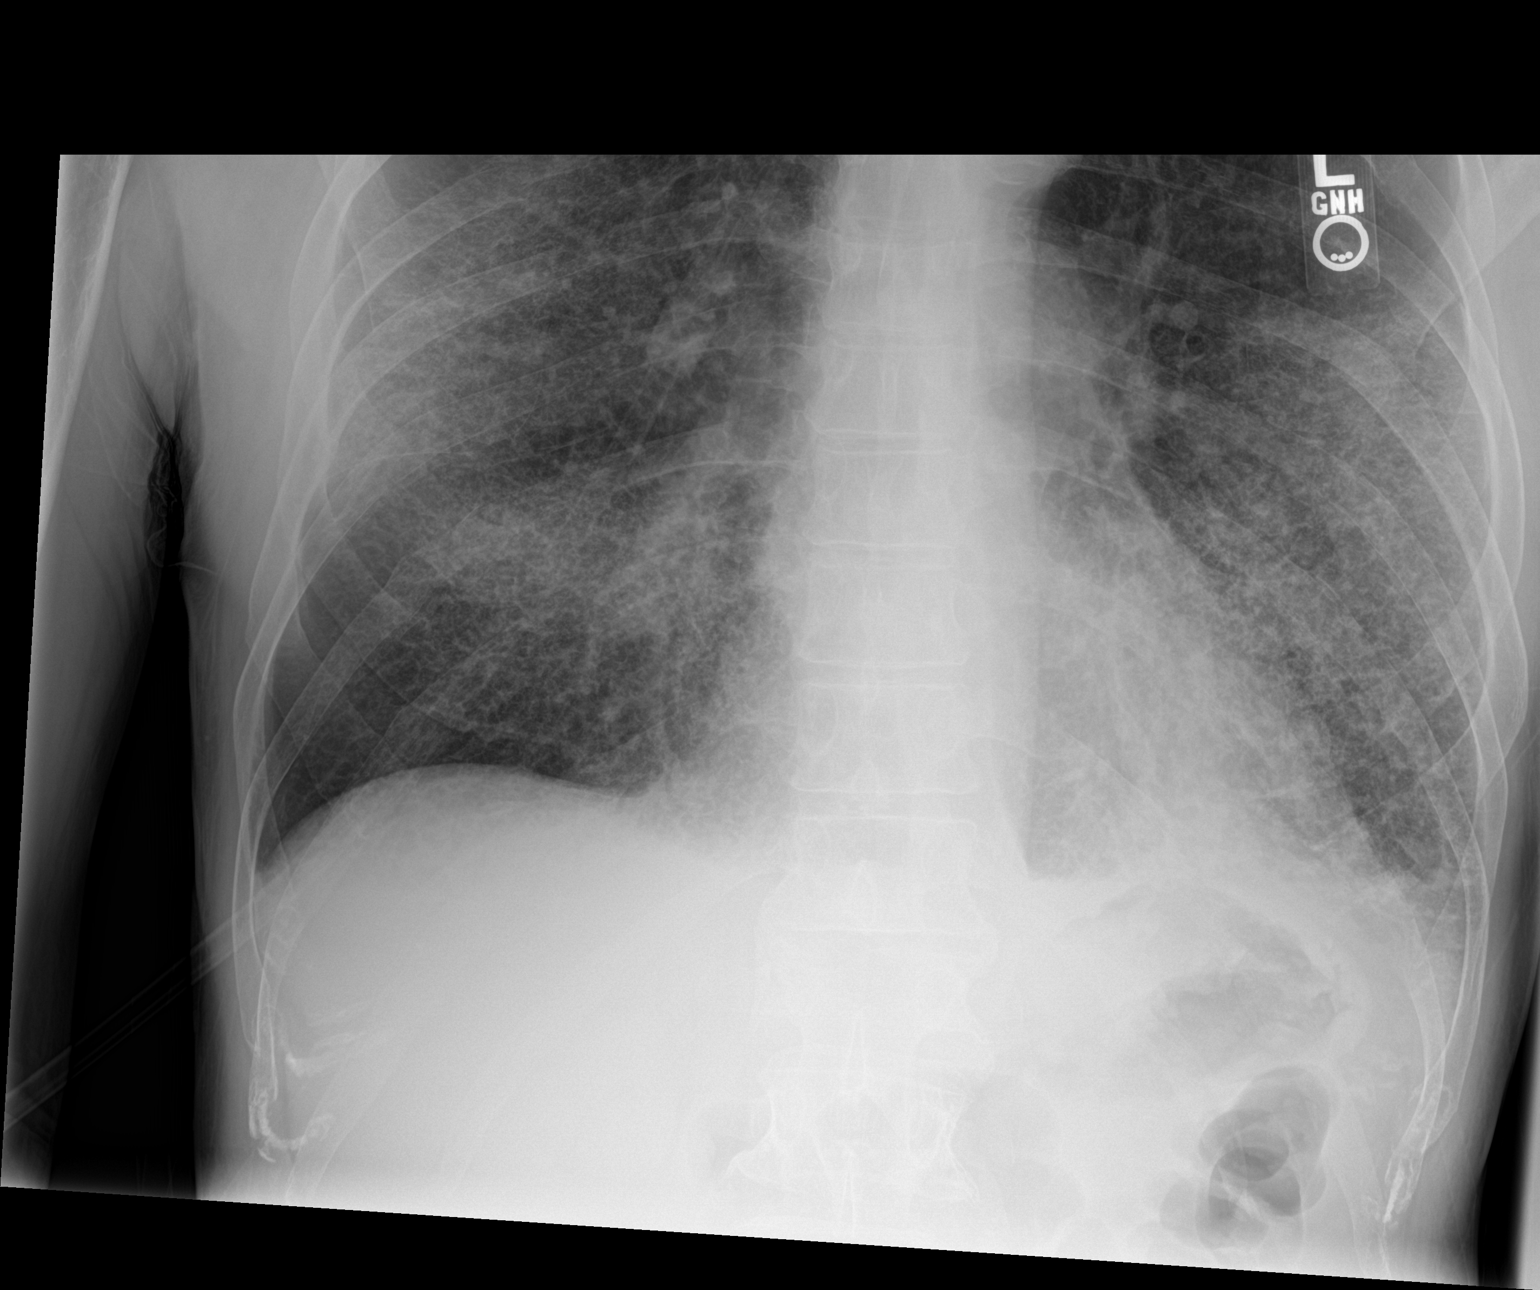

[chest ap (2 of 2)]
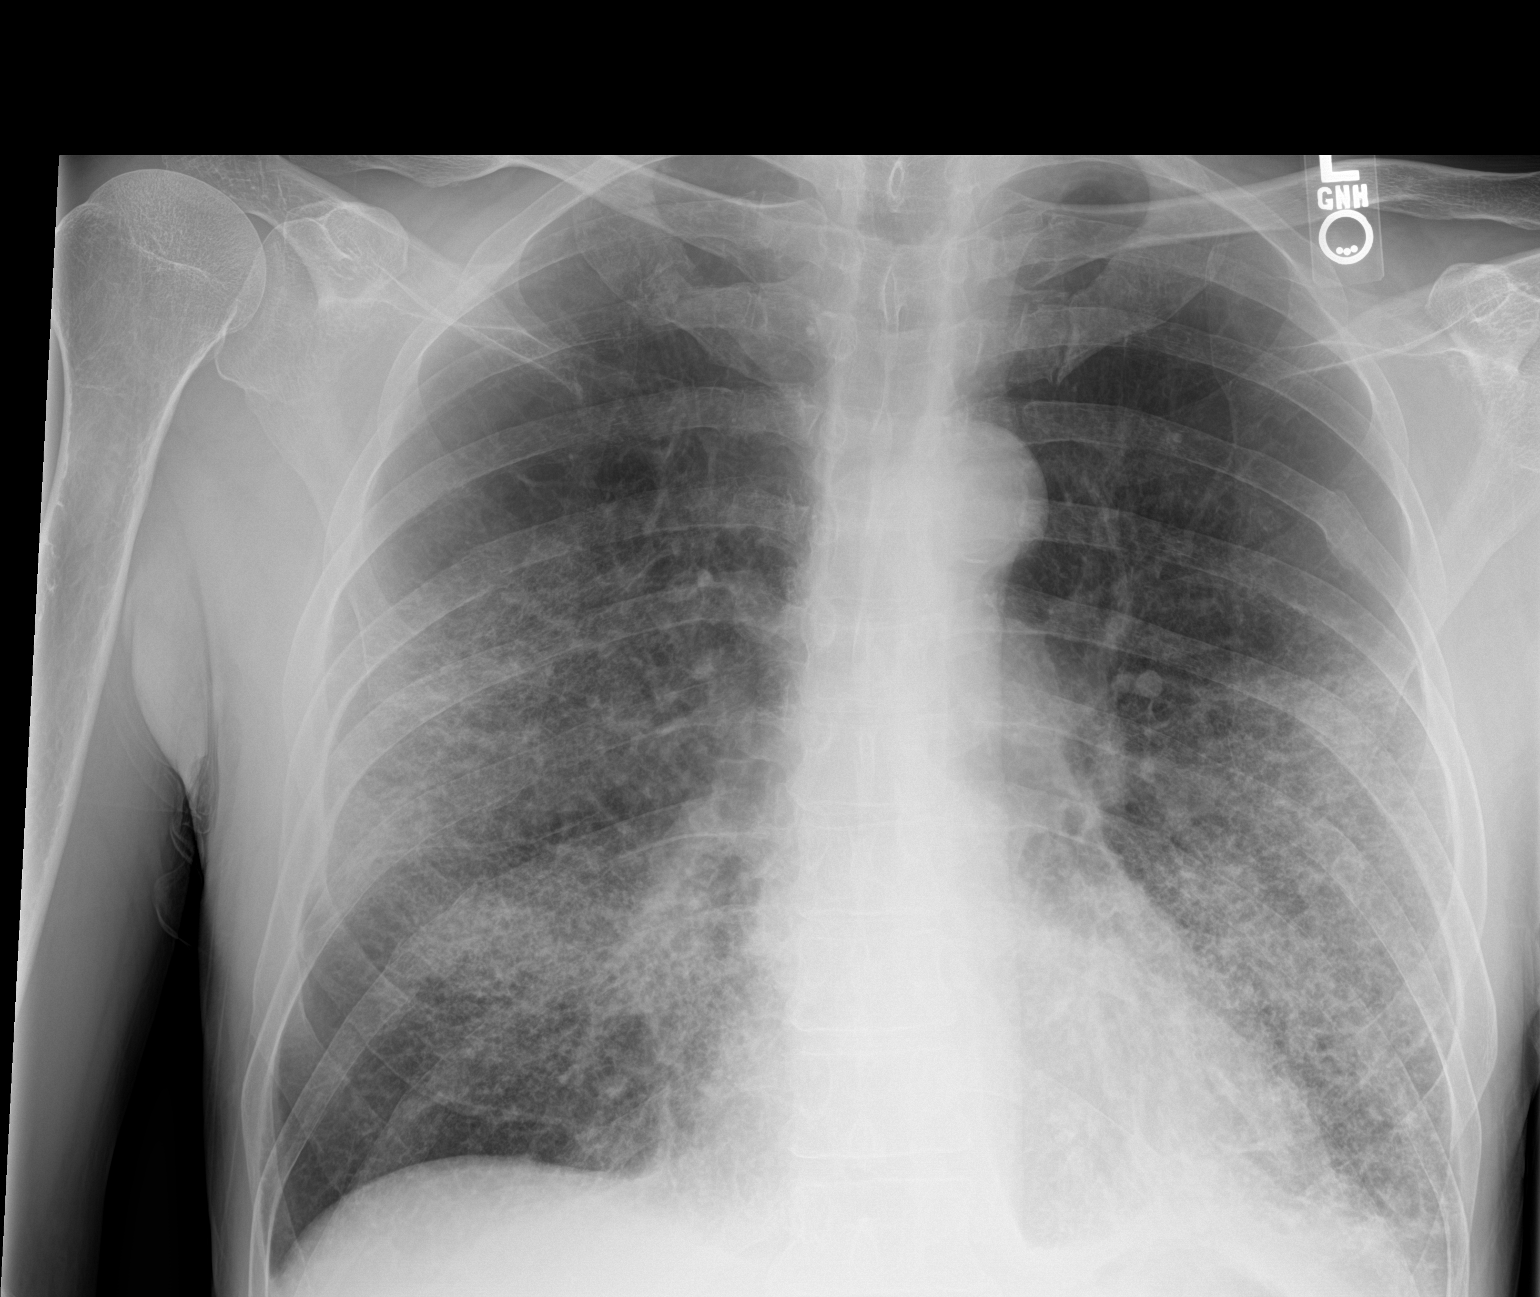

[2 of 2 positions shown; findings below may reference images not displayed]

FINDINGS: There is widespread airspace opacity throughout both mid and lower
lung zones, most severe in the left base where there is frank
consolidation. There is a small left pleural effusion.

Heart size and pulmonary vascularity are normal. No adenopathy.
There is aortic atherosclerosis. There is a recent appearing
fracture of the posterior left fifth rib. No pneumothorax.
IMPRESSION: Widespread apparent pneumonia throughout mid and lower lung zones
bilaterally with the greatest degree of consolidation in the left
base. Small left pleural effusion. Stable cardiac silhouette. No
adenopathy evident. Aortic Atherosclerosis ([id number]).

## 2020-10-06 IMAGING — DX PORTABLE CHEST - 1 VIEW
2 series · 2 of 2 positions shown · non-contrast
Comparison: 12/26/2018 and prior radiographs

CLINICAL DATA: Increasing shortness of breath.

EXAM:
PORTABLE CHEST 1 VIEW

[chest ap (1 of 2)]
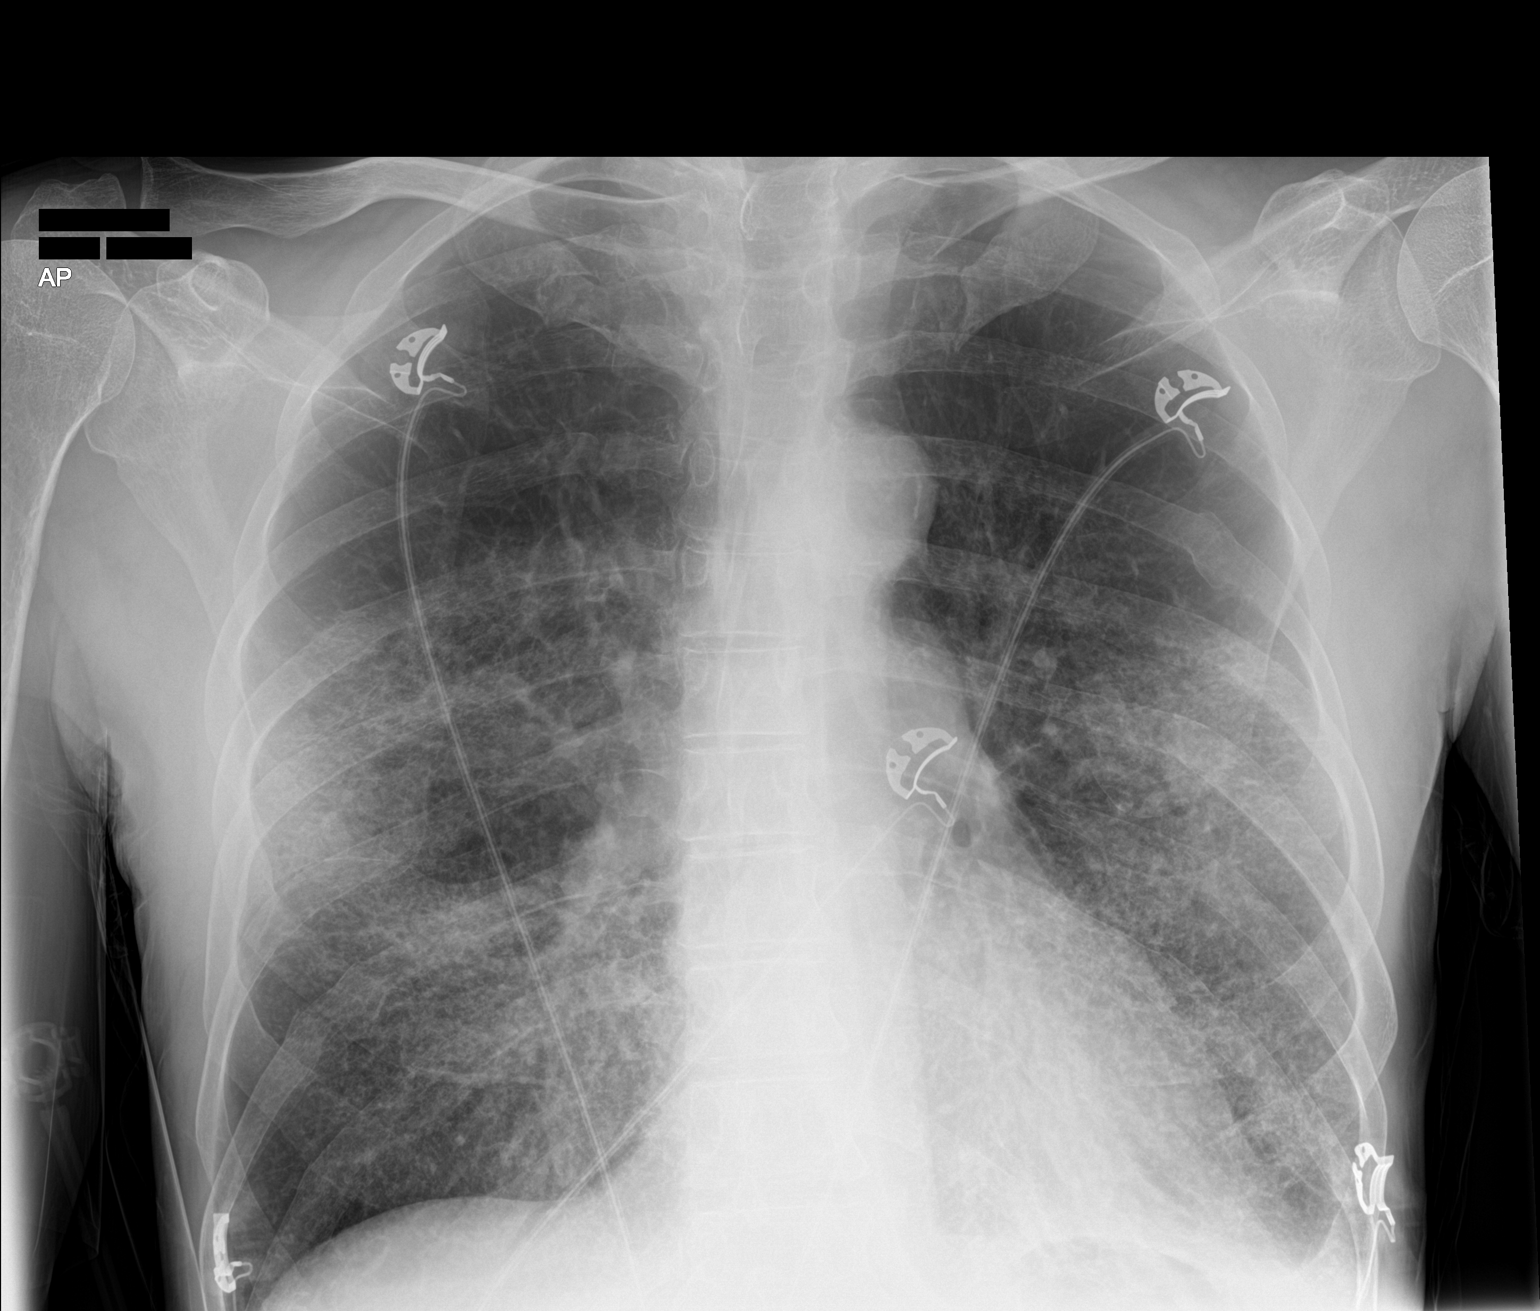

[chest ap (2 of 2)]
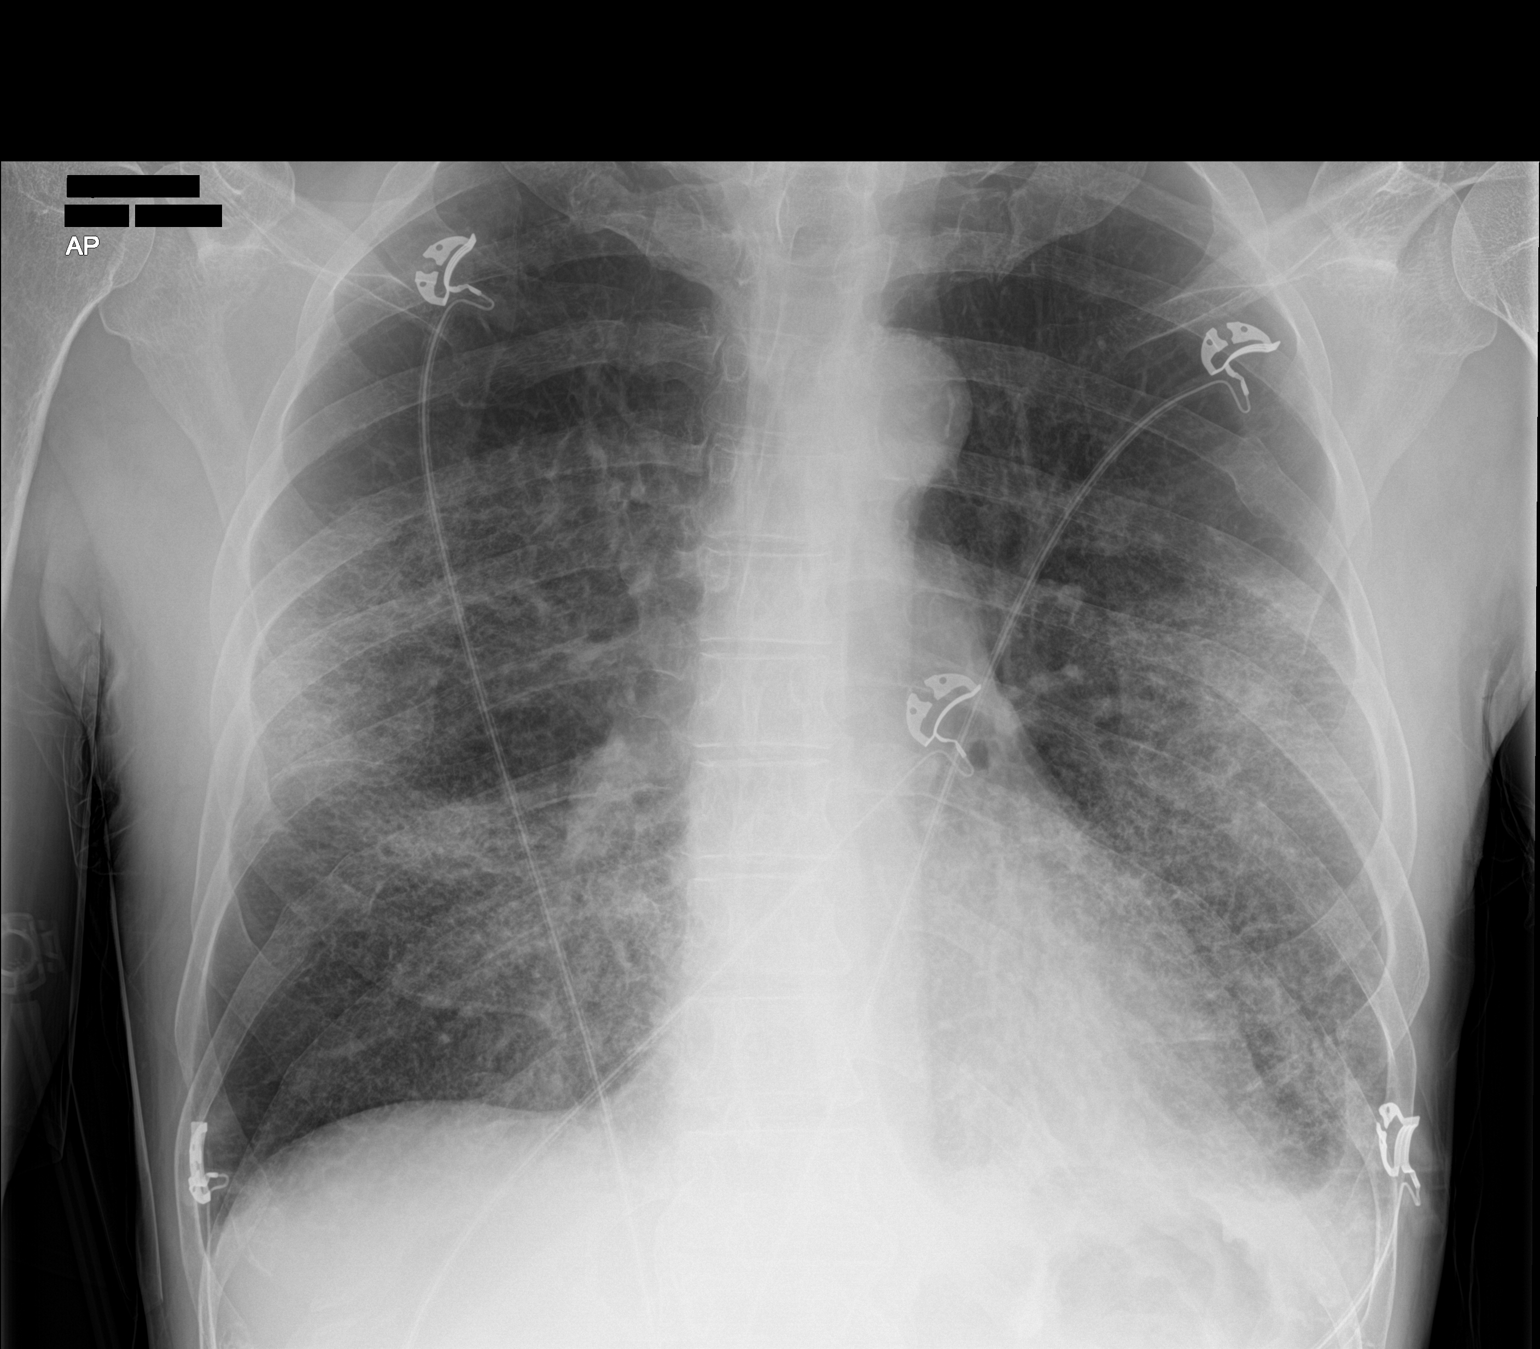

[2 of 2 positions shown; findings below may reference images not displayed]

FINDINGS: The cardiomediastinal silhouette is unremarkable.

Bilateral interstitial and airspace opacities within the mid and
lower lungs unchanged from yesterday.

A possible trace LEFT pleural effusion noted.

No pneumothorax or acute bony abnormality.
IMPRESSION: Unchanged bilateral interstitial and airspace opacities suspicious
for pneumonia. Possible trace LEFT pleural effusion.

## 2020-10-10 IMAGING — DX PORTABLE CHEST - 1 VIEW
1 series · 1 of 1 positions shown · non-contrast
Comparison: Two days ago

CLINICAL DATA: Abnormal respiration

EXAM:
PORTABLE CHEST 1 VIEW

[chest ap]
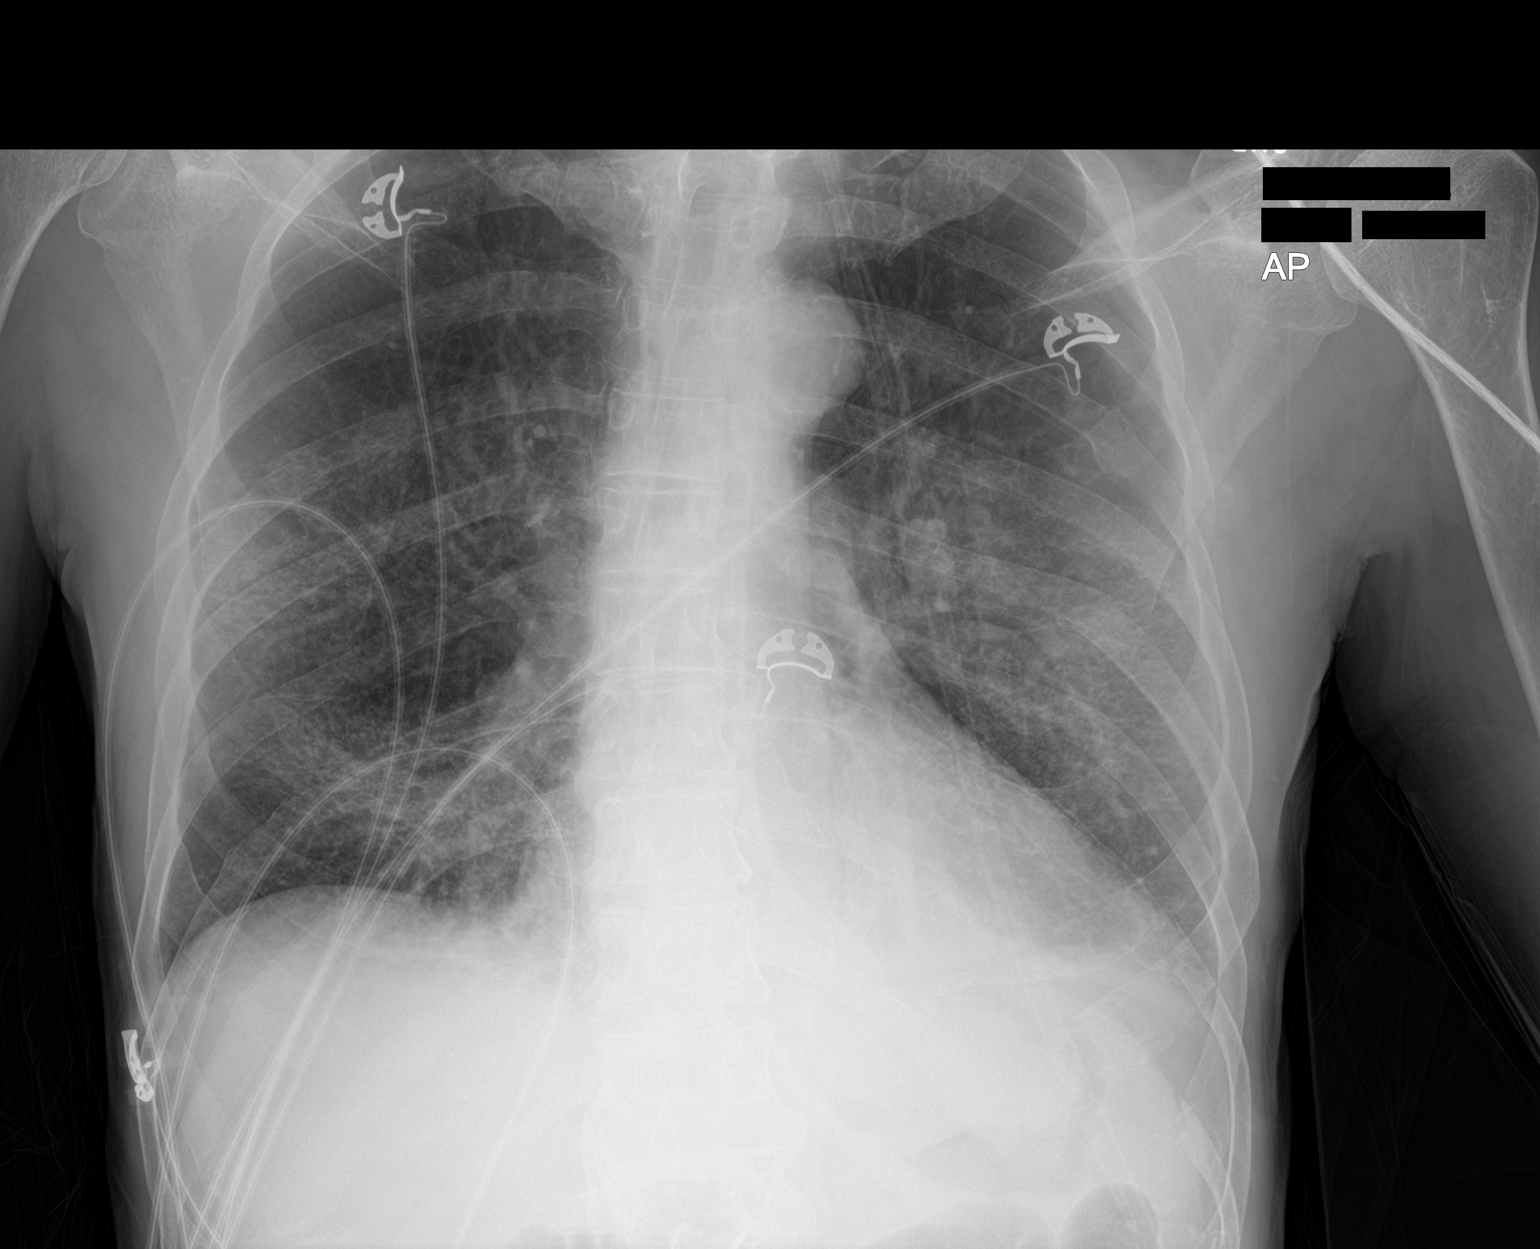

[1 of 1 positions shown; findings below may reference images not displayed]

FINDINGS: Interstitial opacity is patchy on the right and more generalized and
hazy on the left. Hyperinflation and apical lucency. There is volume
loss and more dense opacity in the retrocardiac lung where there was
airway impaction by prior CT.
IMPRESSION: 1. History of pneumonia with mild increase in left lung opacity.
2. Emphysema.  Lower lobe mucoid impaction on recent chest CT.

## 2022-02-19 ENCOUNTER — Encounter: Payer: Self-pay | Admitting: Specialist

## 2022-08-08 ENCOUNTER — Encounter: Payer: No Typology Code available for payment source | Attending: Psychology | Admitting: Psychology

## 2022-08-08 DIAGNOSIS — F4312 Post-traumatic stress disorder, chronic: Secondary | ICD-10-CM | POA: Insufficient documentation

## 2022-08-08 DIAGNOSIS — G3184 Mild cognitive impairment, so stated: Secondary | ICD-10-CM | POA: Insufficient documentation

## 2022-08-08 DIAGNOSIS — F09 Unspecified mental disorder due to known physiological condition: Secondary | ICD-10-CM | POA: Insufficient documentation

## 2022-08-08 DIAGNOSIS — F411 Generalized anxiety disorder: Secondary | ICD-10-CM | POA: Diagnosis not present

## 2022-08-16 ENCOUNTER — Encounter: Payer: No Typology Code available for payment source | Attending: Psychology

## 2022-08-16 DIAGNOSIS — F411 Generalized anxiety disorder: Secondary | ICD-10-CM | POA: Diagnosis present

## 2022-08-16 DIAGNOSIS — F4312 Post-traumatic stress disorder, chronic: Secondary | ICD-10-CM | POA: Insufficient documentation

## 2022-08-16 DIAGNOSIS — F09 Unspecified mental disorder due to known physiological condition: Secondary | ICD-10-CM | POA: Insufficient documentation

## 2022-08-16 DIAGNOSIS — G3184 Mild cognitive impairment, so stated: Secondary | ICD-10-CM | POA: Diagnosis present

## 2022-08-16 NOTE — Progress Notes (Signed)
Behavioral Observations  The patient appeared well-groomed and appropriately dressed. His manners were polite and appropriate to the situation. The patient's attitude toward testing was good and his effort was fair.  Neuropsychology Note  Eugene Christensen completed 210 minutes of neuropsychological testing with technician, Dina Rich, BA, under the supervision of Ilean Skill, PsyD., Clinical Neuropsychologist. The patient did not appear overtly distressed by the testing session, per behavioral observation or via self-report to the technician. Rest breaks were offered.   Clinical Decision Making: In considering the patient's current level of functioning, level of presumed impairment, nature of symptoms, emotional and behavioral responses during clinical interview, level of literacy, and observed level of motivation/effort, a battery of tests was selected by Dr. Sima Matas during initial consultation on 08/08/2022. This was communicated to the technician. Communication between the neuropsychologist and technician was ongoing throughout the testing session and changes were made as deemed necessary based on patient performance on testing, technician observations and additional pertinent factors such as those listed above.  Tests Administered: Wechsler Adult Intelligence Scale, 4th Edition (WAIS-IV) Wechsler Memory Scale, 4th Edition (WMS-IV); Adult Battery   Results:  WAIS-IV  Composite Score Summary  Scale Sum of Scaled Scores Composite Score Percentile Rank 95% Conf. Interval Qualitative Description  Verbal Comprehension 30 VCI 100 50 94-106 Average  Perceptual Reasoning 31 PRI 102 55 96-108 Average  Working Memory 16 WMI 89 23 83-96 Low Average  Processing Speed 18 PSI 94 34 86-103 Average  Full Scale 95 FSIQ 96 39 92-100 Average  General Ability 61 GAI 101 53 96-106 Average       Verbal Comprehension Subtests Summary  Subtest Raw Score Scaled Score Percentile Rank Reference  Group Scaled Score SEM  Similarities 25 10 50 10 1.08  Vocabulary 35 9 37 10 0.73  Information 17 11 63 12 0.67  (Comprehension) 24 10 50 10 1.08        Perceptual Reasoning Subtests Summary  Subtest Raw Score Scaled Score Percentile Rank Reference Group Scaled Score SEM  Block Design 47 13 84 10 1.04  Matrix Reasoning 18 12 75 9 0.95  Visual Puzzles 7 6 9 5  0.99  (Figure Weights) 12 10 50 8 0.99  (Picture Completion) 16 13 84 12 1.12        Working Doctor, general practice Raw Score Scaled Score Percentile Rank Reference Group Scaled Score SEM  Digit Span 25 9 37 8 0.85  Arithmetic 10 7 16 7  1.04  (Letter-Number Seq.) 20 10 50 10 1.08        Processing Speed Subtests Summary  Subtest Raw Score Scaled Score Percentile Rank Reference Group Scaled Score SEM  Symbol Search 22 7 16 6  1.31  Coding 65 11 63 9 0.99  (Cancellation) 44 12 75 11 1.34        WMS-IV  Index Score Summary  Index Sum of Scaled Scores Index Score Percentile Rank 95% Confidence Interval Qualitative Descriptor  Auditory Memory (AMI) 17 65 1 61-73 Extremely Low  Visual Memory (VMI) 18 65 1 61-72 Extremely Low  Visual Working Memory (VWMI) 14 83 13 77-91 Low Average  Immediate Memory (IMI) 21 69 2 64-77 Extremely Low  Delayed Memory (DMI) 14 56 0.2 52-65 Extremely Low       Primary Subtest Scaled Score Summary  Subtest Domain Raw Score Scaled Score Percentile Rank  Logical Memory I AM 11 3 1   Logical Memory II AM 0 1 0.1  Verbal Paired Associates I AM 17  6 9  Verbal Paired Associates II AM 6 7 16   Designs I VM 17 1 0.1  Designs II VM 22 1 0.1  Visual Reproduction I VM 36 11 63  Visual Reproduction II VM 6 5 5   Spatial Addition VWM 6 6 9   Symbol Span VWM 17 8 25        Auditory Memory Process Score Summary  Process Score Raw Score Scaled Score Percentile Rank Cumulative Percentage (Base Rate)  LM II Recognition 18 - - 3-9%  VPA II Recognition 30 - - 3-9%         Visual Memory Process Score Summary  Process Score Raw Score Scaled Score Percentile Rank Cumulative Percentage (Base Rate)  DE I Content 12 1 0.1 -  DE I Spatial 3 1 0.1 -  DE II Content 15 3 1  -  DE II Spatial 3 2 0.4 -  DE II Recognition 16 - - 51-75%  VR II Recognition 5 - - 26-50%       ABILITY-MEMORY ANALYSIS  Ability Score:  GAI: 101 Date of Testing:  WAIS-IV; WMS-IV 2022/08/16  Predicted Difference Method   Index Predicted WMS-IV Index Score Actual WMS-IV Index Score Difference Critical Value  Significant Difference Y/N Base Rate  Auditory Memory 101 65 36 8.95 Y <1%  Visual Memory 101 65 36 8.82 Y <1%  Visual Working Memory 101 83 18 11.24 Y 5-10%  Immediate Memory 101 69 32 10.35 Y <1%  Delayed Memory 101 56 45 10.08 Y <1%  Statistical significance (critical value) at the .01 level.        Feedback to Patient: Eugene Christensen will return on 10/11/2022 for an interactive feedback session with Dr. at which time his test performances, clinical impressions and treatment recommendations will be reviewed in detail. The patient understands he can contact our office should he require our assistance before this time.  210 minutes spent face-to-face with patient administering standardized tests, 30 minutes spent scoring ). [CPT 2022/08/18, 96139]  Full report to follow.

## 2022-08-30 ENCOUNTER — Encounter (HOSPITAL_BASED_OUTPATIENT_CLINIC_OR_DEPARTMENT_OTHER): Payer: No Typology Code available for payment source | Admitting: Psychology

## 2022-08-30 DIAGNOSIS — F0781 Postconcussional syndrome: Secondary | ICD-10-CM

## 2022-08-30 DIAGNOSIS — J441 Chronic obstructive pulmonary disease with (acute) exacerbation: Secondary | ICD-10-CM

## 2022-08-30 DIAGNOSIS — R4189 Other symptoms and signs involving cognitive functions and awareness: Secondary | ICD-10-CM

## 2022-08-30 DIAGNOSIS — F09 Unspecified mental disorder due to known physiological condition: Secondary | ICD-10-CM | POA: Diagnosis not present

## 2022-08-30 DIAGNOSIS — R413 Other amnesia: Secondary | ICD-10-CM | POA: Diagnosis not present

## 2022-08-30 DIAGNOSIS — Z8673 Personal history of transient ischemic attack (TIA), and cerebral infarction without residual deficits: Secondary | ICD-10-CM | POA: Diagnosis not present

## 2022-08-30 DIAGNOSIS — F4312 Post-traumatic stress disorder, chronic: Secondary | ICD-10-CM | POA: Diagnosis not present

## 2022-09-23 ENCOUNTER — Encounter: Payer: Self-pay | Admitting: Psychology
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
## 2022-10-11 ENCOUNTER — Encounter: Payer: Self-pay | Admitting: Psychology

## 2022-10-11 ENCOUNTER — Encounter: Payer: No Typology Code available for payment source | Attending: Psychology | Admitting: Psychology

## 2022-10-11 DIAGNOSIS — R413 Other amnesia: Secondary | ICD-10-CM

## 2022-10-11 DIAGNOSIS — J441 Chronic obstructive pulmonary disease with (acute) exacerbation: Secondary | ICD-10-CM | POA: Diagnosis present

## 2022-10-11 DIAGNOSIS — R4189 Other symptoms and signs involving cognitive functions and awareness: Secondary | ICD-10-CM | POA: Diagnosis not present

## 2022-10-11 DIAGNOSIS — F0781 Postconcussional syndrome: Secondary | ICD-10-CM | POA: Diagnosis present

## 2022-10-11 DIAGNOSIS — F4312 Post-traumatic stress disorder, chronic: Secondary | ICD-10-CM

## 2022-10-11 DIAGNOSIS — Z8673 Personal history of transient ischemic attack (TIA), and cerebral infarction without residual deficits: Secondary | ICD-10-CM | POA: Diagnosis not present
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
## 2023-03-25 DIAGNOSIS — I82621 Acute embolism and thrombosis of deep veins of right upper extremity: Secondary | ICD-10-CM | POA: Diagnosis not present

## 2023-03-25 DIAGNOSIS — C139 Malignant neoplasm of hypopharynx, unspecified: Secondary | ICD-10-CM | POA: Diagnosis not present

## 2023-03-25 DIAGNOSIS — L7682 Other postprocedural complications of skin and subcutaneous tissue: Secondary | ICD-10-CM | POA: Diagnosis not present

## 2023-03-26 DIAGNOSIS — C139 Malignant neoplasm of hypopharynx, unspecified: Secondary | ICD-10-CM

## 2023-03-26 DIAGNOSIS — L7682 Other postprocedural complications of skin and subcutaneous tissue: Secondary | ICD-10-CM

## 2023-03-26 DIAGNOSIS — I82621 Acute embolism and thrombosis of deep veins of right upper extremity: Secondary | ICD-10-CM

## 2023-03-28 DIAGNOSIS — Z930 Tracheostomy status: Secondary | ICD-10-CM | POA: Diagnosis not present

## 2023-03-28 DIAGNOSIS — C4492 Squamous cell carcinoma of skin, unspecified: Secondary | ICD-10-CM | POA: Diagnosis not present

## 2023-03-28 DIAGNOSIS — J9621 Acute and chronic respiratory failure with hypoxia: Secondary | ICD-10-CM | POA: Diagnosis not present

## 2023-03-28 DIAGNOSIS — J189 Pneumonia, unspecified organism: Secondary | ICD-10-CM | POA: Diagnosis not present

## 2023-03-29 DIAGNOSIS — J9621 Acute and chronic respiratory failure with hypoxia: Secondary | ICD-10-CM | POA: Diagnosis not present

## 2023-03-29 DIAGNOSIS — C4492 Squamous cell carcinoma of skin, unspecified: Secondary | ICD-10-CM | POA: Diagnosis not present

## 2023-03-29 DIAGNOSIS — J189 Pneumonia, unspecified organism: Secondary | ICD-10-CM | POA: Diagnosis not present

## 2023-03-29 DIAGNOSIS — Z930 Tracheostomy status: Secondary | ICD-10-CM | POA: Diagnosis not present

## 2023-03-30 DIAGNOSIS — J9621 Acute and chronic respiratory failure with hypoxia: Secondary | ICD-10-CM | POA: Diagnosis not present

## 2023-03-30 DIAGNOSIS — Z930 Tracheostomy status: Secondary | ICD-10-CM | POA: Diagnosis not present

## 2023-03-30 DIAGNOSIS — J189 Pneumonia, unspecified organism: Secondary | ICD-10-CM | POA: Diagnosis not present

## 2023-03-30 DIAGNOSIS — C4492 Squamous cell carcinoma of skin, unspecified: Secondary | ICD-10-CM | POA: Diagnosis not present

## 2023-03-31 DIAGNOSIS — Z930 Tracheostomy status: Secondary | ICD-10-CM | POA: Diagnosis not present

## 2023-03-31 DIAGNOSIS — J189 Pneumonia, unspecified organism: Secondary | ICD-10-CM | POA: Diagnosis not present

## 2023-03-31 DIAGNOSIS — J9621 Acute and chronic respiratory failure with hypoxia: Secondary | ICD-10-CM | POA: Diagnosis not present

## 2023-03-31 DIAGNOSIS — C4492 Squamous cell carcinoma of skin, unspecified: Secondary | ICD-10-CM | POA: Diagnosis not present

## 2023-04-01 DIAGNOSIS — I82621 Acute embolism and thrombosis of deep veins of right upper extremity: Secondary | ICD-10-CM | POA: Diagnosis not present

## 2023-04-01 DIAGNOSIS — L7682 Other postprocedural complications of skin and subcutaneous tissue: Secondary | ICD-10-CM | POA: Diagnosis not present

## 2023-04-01 DIAGNOSIS — Z930 Tracheostomy status: Secondary | ICD-10-CM | POA: Diagnosis not present

## 2023-04-01 DIAGNOSIS — C4492 Squamous cell carcinoma of skin, unspecified: Secondary | ICD-10-CM | POA: Diagnosis not present

## 2023-04-01 DIAGNOSIS — J9621 Acute and chronic respiratory failure with hypoxia: Secondary | ICD-10-CM | POA: Diagnosis not present

## 2023-04-01 DIAGNOSIS — J189 Pneumonia, unspecified organism: Secondary | ICD-10-CM | POA: Diagnosis not present

## 2023-04-01 DIAGNOSIS — C139 Malignant neoplasm of hypopharynx, unspecified: Secondary | ICD-10-CM | POA: Diagnosis not present

## 2023-04-02 DIAGNOSIS — J9621 Acute and chronic respiratory failure with hypoxia: Secondary | ICD-10-CM | POA: Diagnosis not present

## 2023-04-02 DIAGNOSIS — L7682 Other postprocedural complications of skin and subcutaneous tissue: Secondary | ICD-10-CM | POA: Diagnosis not present

## 2023-04-02 DIAGNOSIS — C4492 Squamous cell carcinoma of skin, unspecified: Secondary | ICD-10-CM | POA: Diagnosis not present

## 2023-04-02 DIAGNOSIS — C139 Malignant neoplasm of hypopharynx, unspecified: Secondary | ICD-10-CM | POA: Diagnosis not present

## 2023-04-02 DIAGNOSIS — J189 Pneumonia, unspecified organism: Secondary | ICD-10-CM | POA: Diagnosis not present

## 2023-04-02 DIAGNOSIS — Z930 Tracheostomy status: Secondary | ICD-10-CM | POA: Diagnosis not present

## 2023-04-02 DIAGNOSIS — I82621 Acute embolism and thrombosis of deep veins of right upper extremity: Secondary | ICD-10-CM | POA: Diagnosis not present

## 2023-04-03 DIAGNOSIS — Z930 Tracheostomy status: Secondary | ICD-10-CM | POA: Diagnosis not present

## 2023-04-03 DIAGNOSIS — C4492 Squamous cell carcinoma of skin, unspecified: Secondary | ICD-10-CM | POA: Diagnosis not present

## 2023-04-03 DIAGNOSIS — J189 Pneumonia, unspecified organism: Secondary | ICD-10-CM | POA: Diagnosis not present

## 2023-04-03 DIAGNOSIS — J9621 Acute and chronic respiratory failure with hypoxia: Secondary | ICD-10-CM | POA: Diagnosis not present

## 2023-04-03 DIAGNOSIS — L7682 Other postprocedural complications of skin and subcutaneous tissue: Secondary | ICD-10-CM | POA: Diagnosis not present

## 2023-04-03 DIAGNOSIS — I82621 Acute embolism and thrombosis of deep veins of right upper extremity: Secondary | ICD-10-CM | POA: Diagnosis not present

## 2023-04-03 DIAGNOSIS — C139 Malignant neoplasm of hypopharynx, unspecified: Secondary | ICD-10-CM | POA: Diagnosis not present

## 2023-04-04 DIAGNOSIS — L7682 Other postprocedural complications of skin and subcutaneous tissue: Secondary | ICD-10-CM | POA: Diagnosis not present

## 2023-04-04 DIAGNOSIS — C139 Malignant neoplasm of hypopharynx, unspecified: Secondary | ICD-10-CM | POA: Diagnosis not present

## 2023-04-04 DIAGNOSIS — I82621 Acute embolism and thrombosis of deep veins of right upper extremity: Secondary | ICD-10-CM | POA: Diagnosis not present

## 2023-04-05 DIAGNOSIS — I82621 Acute embolism and thrombosis of deep veins of right upper extremity: Secondary | ICD-10-CM | POA: Diagnosis not present

## 2023-04-05 DIAGNOSIS — C139 Malignant neoplasm of hypopharynx, unspecified: Secondary | ICD-10-CM | POA: Diagnosis not present

## 2023-04-05 DIAGNOSIS — L7682 Other postprocedural complications of skin and subcutaneous tissue: Secondary | ICD-10-CM | POA: Diagnosis not present

## 2023-04-08 DIAGNOSIS — C139 Malignant neoplasm of hypopharynx, unspecified: Secondary | ICD-10-CM | POA: Diagnosis not present

## 2023-04-08 DIAGNOSIS — L7682 Other postprocedural complications of skin and subcutaneous tissue: Secondary | ICD-10-CM | POA: Diagnosis not present

## 2023-04-08 DIAGNOSIS — I82621 Acute embolism and thrombosis of deep veins of right upper extremity: Secondary | ICD-10-CM | POA: Diagnosis not present

## 2023-04-09 DIAGNOSIS — C139 Malignant neoplasm of hypopharynx, unspecified: Secondary | ICD-10-CM | POA: Diagnosis not present

## 2023-04-09 DIAGNOSIS — L7682 Other postprocedural complications of skin and subcutaneous tissue: Secondary | ICD-10-CM | POA: Diagnosis not present

## 2023-04-09 DIAGNOSIS — I82621 Acute embolism and thrombosis of deep veins of right upper extremity: Secondary | ICD-10-CM | POA: Diagnosis not present

## 2023-04-10 DIAGNOSIS — C139 Malignant neoplasm of hypopharynx, unspecified: Secondary | ICD-10-CM | POA: Diagnosis not present

## 2023-04-10 DIAGNOSIS — L7682 Other postprocedural complications of skin and subcutaneous tissue: Secondary | ICD-10-CM | POA: Diagnosis not present

## 2023-04-10 DIAGNOSIS — I82621 Acute embolism and thrombosis of deep veins of right upper extremity: Secondary | ICD-10-CM | POA: Diagnosis not present

## 2023-11-16 DEATH — deceased
